# Patient Record
Sex: Female | Born: 1965
Health system: Southern US, Community
[De-identification: ages and names within clinical notes are randomized; demographics above are authoritative.]

## PROBLEM LIST (undated history)

## (undated) DIAGNOSIS — F329 Major depressive disorder, single episode, unspecified: Secondary | ICD-10-CM

## (undated) DIAGNOSIS — R112 Nausea with vomiting, unspecified: Secondary | ICD-10-CM

## (undated) DIAGNOSIS — B009 Herpesviral infection, unspecified: Secondary | ICD-10-CM

## (undated) DIAGNOSIS — N390 Urinary tract infection, site not specified: Secondary | ICD-10-CM

## (undated) DIAGNOSIS — F419 Anxiety disorder, unspecified: Secondary | ICD-10-CM

## (undated) DIAGNOSIS — F32A Depression, unspecified: Secondary | ICD-10-CM

## (undated) DIAGNOSIS — Z9889 Other specified postprocedural states: Secondary | ICD-10-CM

## (undated) DIAGNOSIS — K219 Gastro-esophageal reflux disease without esophagitis: Secondary | ICD-10-CM

## (undated) HISTORY — PX: TUBAL LIGATION: SHX77

---

## 2000-06-24 ENCOUNTER — Ambulatory Visit (HOSPITAL_COMMUNITY): Admission: RE | Admit: 2000-06-24 | Discharge: 2000-06-24 | Payer: Self-pay | Admitting: Internal Medicine

## 2000-08-03 ENCOUNTER — Other Ambulatory Visit: Admission: RE | Admit: 2000-08-03 | Discharge: 2000-08-03 | Payer: Self-pay | Admitting: Obstetrics and Gynecology

## 2001-02-08 HISTORY — PX: OTHER SURGICAL HISTORY: SHX169

## 2001-03-10 ENCOUNTER — Emergency Department (HOSPITAL_COMMUNITY): Admission: EM | Admit: 2001-03-10 | Discharge: 2001-03-10 | Payer: Self-pay | Admitting: Emergency Medicine

## 2001-07-31 ENCOUNTER — Encounter: Payer: Self-pay | Admitting: Family Medicine

## 2001-07-31 ENCOUNTER — Ambulatory Visit (HOSPITAL_COMMUNITY): Admission: RE | Admit: 2001-07-31 | Discharge: 2001-07-31 | Payer: Self-pay | Admitting: Family Medicine

## 2001-08-07 ENCOUNTER — Ambulatory Visit (HOSPITAL_COMMUNITY): Admission: RE | Admit: 2001-08-07 | Discharge: 2001-08-07 | Payer: Self-pay | Admitting: Family Medicine

## 2001-08-07 ENCOUNTER — Encounter: Payer: Self-pay | Admitting: Family Medicine

## 2001-12-04 ENCOUNTER — Encounter: Payer: Self-pay | Admitting: Obstetrics & Gynecology

## 2001-12-04 ENCOUNTER — Ambulatory Visit (HOSPITAL_COMMUNITY): Admission: RE | Admit: 2001-12-04 | Discharge: 2001-12-04 | Payer: Self-pay | Admitting: Obstetrics & Gynecology

## 2002-07-02 ENCOUNTER — Encounter: Payer: Self-pay | Admitting: Obstetrics & Gynecology

## 2002-07-02 ENCOUNTER — Ambulatory Visit (HOSPITAL_COMMUNITY): Admission: RE | Admit: 2002-07-02 | Discharge: 2002-07-02 | Payer: Self-pay | Admitting: Obstetrics & Gynecology

## 2003-02-09 HISTORY — PX: DIAGNOSTIC LAPAROSCOPY: SUR761

## 2003-10-31 ENCOUNTER — Ambulatory Visit (HOSPITAL_COMMUNITY): Admission: RE | Admit: 2003-10-31 | Discharge: 2003-10-31 | Payer: Self-pay | Admitting: Obstetrics & Gynecology

## 2004-09-08 ENCOUNTER — Ambulatory Visit (HOSPITAL_COMMUNITY): Admission: RE | Admit: 2004-09-08 | Discharge: 2004-09-08 | Payer: Self-pay | Admitting: Obstetrics & Gynecology

## 2005-09-23 ENCOUNTER — Ambulatory Visit (HOSPITAL_COMMUNITY): Admission: RE | Admit: 2005-09-23 | Discharge: 2005-09-23 | Payer: Self-pay | Admitting: Obstetrics and Gynecology

## 2006-02-08 HISTORY — PX: DILATION AND CURETTAGE OF UTERUS: SHX78

## 2006-03-27 ENCOUNTER — Emergency Department (HOSPITAL_COMMUNITY): Admission: EM | Admit: 2006-03-27 | Discharge: 2006-03-27 | Payer: Self-pay | Admitting: Family Medicine

## 2006-04-06 ENCOUNTER — Emergency Department (HOSPITAL_COMMUNITY): Admission: EM | Admit: 2006-04-06 | Discharge: 2006-04-06 | Payer: Self-pay | Admitting: Family Medicine

## 2006-06-19 ENCOUNTER — Emergency Department (HOSPITAL_COMMUNITY): Admission: EM | Admit: 2006-06-19 | Discharge: 2006-06-19 | Payer: Self-pay | Admitting: Emergency Medicine

## 2006-10-22 ENCOUNTER — Emergency Department (HOSPITAL_COMMUNITY): Admission: EM | Admit: 2006-10-22 | Discharge: 2006-10-22 | Payer: Self-pay | Admitting: Emergency Medicine

## 2006-12-02 ENCOUNTER — Encounter (HOSPITAL_COMMUNITY): Payer: Self-pay | Admitting: Obstetrics and Gynecology

## 2006-12-02 ENCOUNTER — Ambulatory Visit (HOSPITAL_COMMUNITY): Admission: RE | Admit: 2006-12-02 | Discharge: 2006-12-02 | Payer: Self-pay | Admitting: Obstetrics and Gynecology

## 2007-07-21 ENCOUNTER — Emergency Department (HOSPITAL_COMMUNITY): Admission: EM | Admit: 2007-07-21 | Discharge: 2007-07-22 | Payer: Self-pay | Admitting: Emergency Medicine

## 2007-07-29 ENCOUNTER — Encounter: Admission: RE | Admit: 2007-07-29 | Discharge: 2007-07-29 | Payer: Self-pay | Admitting: Neurosurgery

## 2008-02-09 HISTORY — PX: BLADDER SUSPENSION: SHX72

## 2008-02-09 HISTORY — PX: VAGINAL HYSTERECTOMY: SUR661

## 2008-11-04 ENCOUNTER — Encounter (HOSPITAL_COMMUNITY): Payer: Self-pay | Admitting: Obstetrics and Gynecology

## 2008-11-04 ENCOUNTER — Ambulatory Visit (HOSPITAL_COMMUNITY): Admission: RE | Admit: 2008-11-04 | Discharge: 2008-11-05 | Payer: Self-pay | Admitting: Obstetrics and Gynecology

## 2009-04-07 ENCOUNTER — Observation Stay (HOSPITAL_COMMUNITY): Admission: EM | Admit: 2009-04-07 | Discharge: 2009-04-08 | Payer: Self-pay | Admitting: Emergency Medicine

## 2009-04-07 ENCOUNTER — Ambulatory Visit: Payer: Self-pay | Admitting: Cardiovascular Disease

## 2009-04-08 ENCOUNTER — Encounter (INDEPENDENT_AMBULATORY_CARE_PROVIDER_SITE_OTHER): Payer: Self-pay | Admitting: Emergency Medicine

## 2010-01-29 ENCOUNTER — Encounter
Admission: RE | Admit: 2010-01-29 | Discharge: 2010-01-29 | Payer: Self-pay | Source: Home / Self Care | Attending: Internal Medicine | Admitting: Internal Medicine

## 2010-02-08 HISTORY — PX: BACK SURGERY: SHX140

## 2010-03-01 ENCOUNTER — Encounter: Payer: Self-pay | Admitting: Obstetrics & Gynecology

## 2010-04-29 LAB — POCT I-STAT, CHEM 8
Calcium, Ion: 1.11 mmol/L — ABNORMAL LOW (ref 1.12–1.32)
Creatinine, Ser: 0.8 mg/dL (ref 0.4–1.2)
Glucose, Bld: 109 mg/dL — ABNORMAL HIGH (ref 70–99)
HCT: 37 % (ref 36.0–46.0)
Hemoglobin: 12.6 g/dL (ref 12.0–15.0)
Potassium: 3.9 mEq/L (ref 3.5–5.1)

## 2010-04-29 LAB — CBC
HCT: 37.3 % (ref 36.0–46.0)
Hemoglobin: 12.9 g/dL (ref 12.0–15.0)
RBC: 4.28 MIL/uL (ref 3.87–5.11)
RDW: 13.5 % (ref 11.5–15.5)
WBC: 8.3 10*3/uL (ref 4.0–10.5)

## 2010-04-29 LAB — HEPATIC FUNCTION PANEL
ALT: 20 U/L (ref 0–35)
AST: 23 U/L (ref 0–37)
Alkaline Phosphatase: 60 U/L (ref 39–117)
Bilirubin, Direct: <0.1 mg/dL (ref 0.0–0.3)
Total Bilirubin: 0.5 mg/dL (ref 0.3–1.2)

## 2010-04-29 LAB — DIFFERENTIAL
Basophils Absolute: 0 10*3/uL (ref 0.0–0.1)
Eosinophils Relative: 1 % (ref 0–5)
Lymphocytes Relative: 31 % (ref 12–46)
Lymphs Abs: 2.6 10*3/uL (ref 0.7–4.0)
Monocytes Absolute: 0.5 10*3/uL (ref 0.1–1.0)
Monocytes Relative: 6 % (ref 3–12)
Neutro Abs: 5.1 10*3/uL (ref 1.7–7.7)

## 2010-04-29 LAB — POCT CARDIAC MARKERS
CKMB, poc: <1 ng/mL — ABNORMAL LOW (ref 1.0–8.0)
Troponin i, poc: <0.05 ng/mL (ref 0.00–0.09)

## 2010-05-03 LAB — POCT CARDIAC MARKERS
CKMB, poc: <1 ng/mL — ABNORMAL LOW (ref 1.0–8.0)
Myoglobin, poc: 33.3 ng/mL (ref 12–200)

## 2010-05-15 LAB — CBC
HCT: 36.1 % (ref 36.0–46.0)
Hemoglobin: 12.2 g/dL (ref 12.0–15.0)
MCHC: 34.2 g/dL (ref 30.0–36.0)
MCV: 87.6 fL (ref 78.0–100.0)
Platelets: 213 10*3/uL (ref 150–400)
RBC: 3.51 MIL/uL — ABNORMAL LOW (ref 3.87–5.11)
RBC: 4.16 MIL/uL (ref 3.87–5.11)
RDW: 14.1 % (ref 11.5–15.5)

## 2010-05-15 LAB — COMPREHENSIVE METABOLIC PANEL
ALT: 17 U/L (ref 0–35)
Alkaline Phosphatase: 62 U/L (ref 39–117)
CO2: 28 mEq/L (ref 19–32)
Chloride: 106 mEq/L (ref 96–112)
GFR calc non Af Amer: >60 mL/min (ref >60)
Glucose, Bld: 104 mg/dL — ABNORMAL HIGH (ref 70–99)
Potassium: 3.2 mEq/L — ABNORMAL LOW (ref 3.5–5.1)
Sodium: 138 mEq/L (ref 135–145)
Total Bilirubin: 0.4 mg/dL (ref 0.3–1.2)

## 2010-05-15 LAB — ABO/RH: ABO/RH(D): A POS

## 2010-05-15 LAB — URINALYSIS, ROUTINE W REFLEX MICROSCOPIC
Bilirubin Urine: NEGATIVE
Glucose, UA: NEGATIVE mg/dL
Ketones, ur: NEGATIVE mg/dL
Protein, ur: NEGATIVE mg/dL
pH: 5.5 (ref 5.0–8.0)

## 2010-05-15 LAB — URINE MICROSCOPIC-ADD ON

## 2010-05-15 LAB — PREGNANCY, URINE: Preg Test, Ur: NEGATIVE

## 2010-05-15 LAB — TYPE AND SCREEN: ABO/RH(D): A POS

## 2010-06-26 NOTE — Op Note (Signed)
NAME:  Joann Hickman, Joann Hickman NO.:  [id number]   MEDICAL RECORD NO.:  [id number]          PATIENT TYPE:  AMB   LOCATION:  DAY                           FACILITY:  [facility]   PHYSICIAN:  Lazaro Arms, M.D.   DATE OF BIRTH:  [birth year]   DATE OF PROCEDURE:  10/31/2003  DATE OF DISCHARGE:                                 OPERATIVE REPORT   PREOPERATIVE DIAGNOSES:  1.  Chronic pelvic pain.  2.  Dyspareunia.  3.  Dysmenorrhea.   POSTOPERATIVE DIAGNOSES:  1.  Chronic pelvic pain.  2.  Dyspareunia.  3.  Dysmenorrhea.   PROCEDURE:  Diagnostic laparoscopy with chromotubation.   SURGEON:  Lazaro Arms, M.D.   ANESTHESIA:  General endotracheal.   FINDINGS:  The patient had no evidence of endometriosis.  She had  appropriate fimbriae.  Ovaries were normal.  No adhesive disease.  Appendix  was normal.  Gallbladder was normal.   DESCRIPTION OF PROCEDURE:  The patient was taken to the operating room and  placed in the supine position where she underwent general endotracheal  anesthesia.  She was placed in the dorsal lithotomy position and prepped and  draped in the usual sterile fashion.  A Foley was placed.   A Pelosi cannula was placed in the cervix.  An incision was made.  A Veress  needle was placed without difficulty.  The peritoneal cavity was  insufflated.  A video laparoscope was placed with a nonbladed trocar under  direct visualization without difficulty.  A 5-mm trocar was placed two  fingerbreadths above the pubis.  The peritoneal cavity was evaluated, and  the above-noted findings were seen.  Chromotubation was performed and the  tubes dilated significantly down almost the entire length, and then maybe 30  seconds after having injected 60 cc of methylene blue diluted dye, a couple  of drops came out.  Really, there I would have to say that this is a  negative chromotubation.  As a result, all of the dye and peritoneal fluid  was removed.  Again, no endometriosis  was seen in the posterior cul-de-sac  or on the uterosacral ligaments.  The instruments were removed.  The gas was  allowed to escape.  The fascia was closed with 0 Vicryl.  The subcutaneous  tissue was closed with 3-0 Vicryl.  The skin was closed using skin staples.   The patient tolerated the procedure well.  She experienced minimal blood  loss and was taken to the recovery room in good and stable condition.  All  counts were correct.  I will see her in the office next week.  She received  Ancef and Toradol prophylactically.      LHE/MEDQ  D:  10/31/2003  T:  10/31/2003  Job:  [id number]

## 2010-06-26 NOTE — Op Note (Signed)
NAME:  Joann Hickman, Joann Hickman NO.:  [id number]   MEDICAL RECORD NO.:  [id number]          PATIENT TYPE:  AMB   LOCATION:  SDC                           FACILITY:  [facility]   PHYSICIAN:  Zelphia Cairo, MD    DATE OF BIRTH:  1965/04/23   DATE OF PROCEDURE:  DATE OF DISCHARGE:  12/02/2006                               OPERATIVE REPORT   PREOPERATIVE DIAGNOSES:  1. Menorrhagia.  2. Possible endometrial polyp.   POSTOPERATIVE DIAGNOSES:  1. Menorrhagia.  2. Possible endometrial polyp, pathology pending.   PROCEDURE:  Cervical block, hysteroscopy, D&C, polypectomy.   SURGEON:  Zelphia Cairo, M.D.   FINDINGS:  Polypoid endometrium, submucous fibroids.   SPECIMEN:  Endometrial curettings to pathology.   ESTIMATED BLOOD LOSS:  Minimal.   COMPLICATIONS:  None.   CONDITION:  Stable and extubated to recovery room.   PROCEDURE:  The patient was taken to the operating room where general  anesthesia was found to be adequate.  She was placed in the dorsal  lithotomy position using Allen stirrups.  She was prepped and draped in  sterile fashion and a catheter was used to drain her bladder for clear  urine.  Bivalve speculum was placed in the vagina and the single-toothed  macular was placed on the anterior lip of the cervix.  The cervix was  serially dilated and the diagnostic hysteroscope was inserted into the  uterine cavity.  The uterine cavity was felt to have multiple polypoid  masses and possible submucous fibroids within the endometrial cavity.  No other abnormalities were noted.  Hysteroscope was removed and  endometrial curetting was performed.  Polyp forceps were inserted and  endometrial polyps were removed.  Cervical block was then provided.  All  instruments were removed from the vagina and the patient was taken to  the recovery room in stable condition.      Zelphia Cairo, MD  Electronically Signed    GA/MEDQ  D:  12/28/2006  T:  12/28/2006  Job:   [id number]

## 2010-11-03 ENCOUNTER — Other Ambulatory Visit: Payer: Self-pay | Admitting: Neurosurgery

## 2010-11-03 DIAGNOSIS — M545 Low back pain: Secondary | ICD-10-CM

## 2010-11-04 ENCOUNTER — Ambulatory Visit
Admission: RE | Admit: 2010-11-04 | Discharge: 2010-11-04 | Disposition: A | Discharge status: Home / Self Care | Payer: 59 | Source: Ambulatory Visit | Attending: Neurosurgery | Admitting: Neurosurgery

## 2010-11-04 DIAGNOSIS — M545 Low back pain: Secondary | ICD-10-CM

## 2010-11-04 MED ORDER — GADOBENATE DIMEGLUMINE 529 MG/ML IV SOLN
18.0000 mL | Freq: Once | INTRAVENOUS | Status: AC | PRN
  Administered 2010-11-04: 18 mL via INTRAVENOUS

## 2010-11-05 LAB — POCT CARDIAC MARKERS
CKMB, poc: <1 — ABNORMAL LOW
CKMB, poc: <1 — ABNORMAL LOW
Myoglobin, poc: 44.1
Troponin i, poc: <0.05

## 2010-11-05 LAB — COMPREHENSIVE METABOLIC PANEL
ALT: 18
AST: 23
CO2: 26
Calcium: 8.6
GFR calc Af Amer: >60
Potassium: 3.6
Sodium: 139
Total Protein: 6.2

## 2010-11-05 LAB — CBC
MCV: 88.7
Platelets: 238
WBC: 8.7

## 2010-11-18 LAB — CBC
MCHC: 34.7
Platelets: 251
RDW: 12.6

## 2010-11-20 LAB — I-STAT 8, (EC8 V) (CONVERTED LAB)
BUN: 14
Bicarbonate: 26.6 — ABNORMAL HIGH
Glucose, Bld: 100 — ABNORMAL HIGH
HCT: 42
Operator id: 284141
pCO2, Ven: 46.3
pH, Ven: 7.367 — ABNORMAL HIGH

## 2010-11-20 LAB — CBC
Hemoglobin: 13.4
MCHC: 34.3
RDW: 12.8

## 2010-11-20 LAB — POCT PREGNANCY, URINE: Preg Test, Ur: NEGATIVE

## 2010-11-20 LAB — URINALYSIS, ROUTINE W REFLEX MICROSCOPIC
Hgb urine dipstick: NEGATIVE
Protein, ur: NEGATIVE
Urobilinogen, UA: 1

## 2010-11-20 LAB — DIFFERENTIAL
Basophils Absolute: 0
Basophils Relative: 1
Monocytes Absolute: 0.4
Neutro Abs: 3.7

## 2012-08-12 ENCOUNTER — Emergency Department (HOSPITAL_COMMUNITY)
Admission: EM | Admit: 2012-08-12 | Discharge: 2012-08-12 | Disposition: A | Discharge status: Home / Self Care | Payer: 59 | Source: Home / Self Care | Attending: Emergency Medicine | Admitting: Emergency Medicine

## 2012-08-12 ENCOUNTER — Encounter (HOSPITAL_COMMUNITY): Payer: Self-pay | Admitting: Emergency Medicine

## 2012-08-12 DIAGNOSIS — J019 Acute sinusitis, unspecified: Secondary | ICD-10-CM

## 2012-08-12 MED ORDER — AMOXICILLIN-POT CLAVULANATE 875-125 MG PO TABS: 1.0000 | ORAL_TABLET | Freq: Two times a day (BID) | ORAL | Status: DC

## 2012-08-12 MED ORDER — NAPROXEN 500 MG PO TABS: 500.0000 mg | ORAL_TABLET | Freq: Two times a day (BID) | ORAL | Status: DC

## 2012-08-12 MED ORDER — DEXAMETHASONE SODIUM PHOSPHATE 10 MG/ML IJ SOLN
INTRAMUSCULAR | Status: AC
  Filled 2012-08-12: qty 1

## 2012-08-12 MED ORDER — KETOROLAC TROMETHAMINE 60 MG/2ML IM SOLN
60.0000 mg | Freq: Once | INTRAMUSCULAR | Status: AC
  Administered 2012-08-12: 60 mg via INTRAMUSCULAR

## 2012-08-12 MED ORDER — MOMETASONE FUROATE 50 MCG/ACT NA SUSP: NASAL | Status: DC

## 2012-08-12 MED ORDER — KETOROLAC TROMETHAMINE 60 MG/2ML IM SOLN
INTRAMUSCULAR | Status: AC
  Filled 2012-08-12: qty 2

## 2012-08-12 MED ORDER — DEXAMETHASONE SODIUM PHOSPHATE 10 MG/ML IJ SOLN: 10.0000 mg | Freq: Once | INTRAMUSCULAR | Status: DC

## 2012-08-12 MED ORDER — PSEUDOEPHEDRINE-GUAIFENESIN ER 120-1200 MG PO TB12: ORAL_TABLET | ORAL | Status: DC

## 2012-08-12 MED ORDER — METHYLPREDNISOLONE 4 MG PO KIT: PACK | ORAL | Status: DC

## 2012-08-12 NOTE — ED Provider Notes (Signed)
History    CSN: [id number] Arrival date & time 08/12/12  1631  First MD Initiated Contact with Patient 08/12/12 1659     Chief Complaint  Patient presents with  . Facial Pain    sinus pressure and pain. nonproductive cough. denies fever and any other symptoms.    (Consider location/radiation/quality/duration/timing/severity/associated sxs/prior Treatment) HPI Comments: 47 year old female presents complaining of pain and pressure around the left eye and into the forehead and severe headache. She also has been feeling subjective fever and chills and she is very congested in her left nostril. This began on Tuesday of this week. She called her primary care physician who phoned in a Z-Pak. She started taking a Z-Pak Thursday, 3 days ago, and does not yet feel any better. She actually feels the pain and pressure getting worse since she started the Z-Pak. She has also been taking ibuprofen without much relief either. She denies rash, cough, shortness of breath, NVD, or any other symptoms associated with this. She occasionally gets sinus infections to feel exactly like this does today.  History reviewed. No pertinent past medical history. Past Surgical History  Procedure Laterality Date  . Abdominal hysterectomy    . Tubal reversal     History reviewed. No pertinent family history. History  Substance Use Topics  . Smoking status: Never Smoker   . Smokeless tobacco: Not on file  . Alcohol Use: No   OB History   Grav Para Term Preterm Abortions TAB SAB Ect Mult Living                 Review of Systems  Constitutional: Positive for fever and chills. Negative for fatigue.  HENT: Positive for congestion, sore throat (mild), postnasal drip and sinus pressure. Negative for ear pain, nosebleeds, rhinorrhea, sneezing, neck stiffness and ear discharge.   Eyes: Negative for visual disturbance.  Respiratory: Negative for cough and shortness of breath.   Cardiovascular: Negative for chest pain,  palpitations and leg swelling.  Gastrointestinal: Negative for nausea, vomiting and abdominal pain.  Endocrine: Negative for polydipsia and polyuria.  Genitourinary: Negative for dysuria, urgency and frequency.  Musculoskeletal: Negative for myalgias and arthralgias.  Skin: Negative for rash.  Neurological: Negative for dizziness, weakness and light-headedness.    Allergies  Review of patient's allergies indicates no known allergies.  Home Medications   Current Outpatient Rx  Name  Route  Sig  Dispense  Refill  . azithromycin (ZITHROMAX) 250 MG tablet   Oral   Take 250 mg by mouth daily.         Marland Kitchen escitalopram (LEXAPRO) 5 MG tablet   Oral   Take 5 mg by mouth daily.         Marland Kitchen amoxicillin-clavulanate (AUGMENTIN) 875-125 MG per tablet   Oral   Take 1 tablet by mouth every 12 (twelve) hours.   14 tablet   0   . mometasone (NASONEX) 50 MCG/ACT nasal spray      2 sprays/nostril BID   17 g   12   . naproxen (NAPROSYN) 500 MG tablet   Oral   Take 1 tablet (500 mg total) by mouth 2 (two) times daily.   60 tablet   0   . Pseudoephedrine-Guaifenesin [phone] MG TB12      1 tab PO BID   30 each   0    BP 145/89  Pulse 89  Temp(Src) 98.4 F (36.9 C) (Oral)  Resp 14  SpO2 100% Physical Exam  Nursing note and  vitals reviewed. Constitutional: She is oriented to person, place, and time. Vital signs are normal. She appears well-developed and well-nourished. She appears distressed (mild).  HENT:  Head: Normocephalic and atraumatic.  Nose: No rhinorrhea or nasal septal hematoma.  No foreign bodies. Left sinus exhibits maxillary sinus tenderness and frontal sinus tenderness.  Eyes: EOM are normal. Pupils are equal, round, and reactive to light.  Cardiovascular: Normal rate, regular rhythm and normal heart sounds.  Exam reveals no gallop and no friction rub.   No murmur heard. Pulmonary/Chest: Effort normal and breath sounds normal. No respiratory distress. She has no  wheezes. She has no rales.  Abdominal: Soft. There is no tenderness.  Neurological: She is alert and oriented to person, place, and time. She has normal strength.  Skin: Skin is warm and dry. She is not diaphoretic.  Psychiatric: She has a normal mood and affect. Her behavior is normal. Judgment normal.    ED Course  Procedures (including critical care time) Labs Reviewed - No data to display No results found. 1. Acute sinusitis     MDM  There is worsening despite 3 days of azithromycin. I have offered her an injection of Toradol and Decadron here today, she declines the Decadron because she says that she has taken prednisone before and it made her "crazy." I will switch her to Augmentin and will also start her with Mucinex D., twice a day naproxen, and Nasonex high dose 2 sprays per nostril twice a day. She will followup again in a few days if she still does not improve.   Meds ordered this encounter  Medications                . DISCONTD: dexamethasone (DECADRON) injection 10 mg    Sig:   . ketorolac (TORADOL) injection 60 mg    Sig:   . amoxicillin-clavulanate (AUGMENTIN) 875-125 MG per tablet    Sig: Take 1 tablet by mouth every 12 (twelve) hours.    Dispense:  14 tablet    Refill:  0  . DISCONTD: methylPREDNISolone (MEDROL DOSEPAK) 4 MG tablet    Sig: Use as directed    Dispense:  21 tablet    Refill:  0  . mometasone (NASONEX) 50 MCG/ACT nasal spray    Sig: 2 sprays/nostril BID    Dispense:  17 g    Refill:  12  . Pseudoephedrine-Guaifenesin [phone] MG TB12    Sig: 1 tab PO BID    Dispense:  30 each    Refill:  0  . naproxen (NAPROSYN) 500 MG tablet    Sig: Take 1 tablet (500 mg total) by mouth 2 (two) times daily.    Dispense:  60 tablet    Refill:  0     Graylon Good, PA-C 08/12/12 [phone]

## 2012-08-12 NOTE — ED Notes (Signed)
Pt declined dexathasone 10 mg injection. Injection wasted in pix. Mw,cma

## 2012-08-12 NOTE — ED Notes (Signed)
Pt c/o sinus pressure and pain. Pain and pressure along with stuffiness is worse on the left side.  Headache. Pt states that her primary wrote for a z pak and she is still currently take that med.  Symptoms started on Tuesday and gradually gotten worse. Denies fever, n/v/d

## 2012-08-12 NOTE — ED Provider Notes (Signed)
Medical screening examination/treatment/procedure(s) were performed by non-physician practitioner and as supervising physician I was immediately available for consultation/collaboration.  Leslee Home, M.D.  Reuben Likes, MD 08/12/12 [phone]

## 2013-01-24 ENCOUNTER — Emergency Department (HOSPITAL_COMMUNITY)
Admission: EM | Admit: 2013-01-24 | Discharge: 2013-01-25 | Disposition: A | Discharge status: Home / Self Care | Payer: 59 | Attending: Emergency Medicine | Admitting: Emergency Medicine

## 2013-01-24 ENCOUNTER — Encounter (HOSPITAL_COMMUNITY): Payer: Self-pay | Admitting: Emergency Medicine

## 2013-01-24 ENCOUNTER — Emergency Department (HOSPITAL_COMMUNITY): Payer: 59

## 2013-01-24 DIAGNOSIS — R059 Cough, unspecified: Secondary | ICD-10-CM | POA: Insufficient documentation

## 2013-01-24 DIAGNOSIS — R05 Cough: Secondary | ICD-10-CM

## 2013-01-24 DIAGNOSIS — R599 Enlarged lymph nodes, unspecified: Secondary | ICD-10-CM | POA: Insufficient documentation

## 2013-01-24 DIAGNOSIS — Z79899 Other long term (current) drug therapy: Secondary | ICD-10-CM | POA: Insufficient documentation

## 2013-01-24 DIAGNOSIS — IMO0002 Reserved for concepts with insufficient information to code with codable children: Secondary | ICD-10-CM | POA: Insufficient documentation

## 2013-01-24 DIAGNOSIS — R0789 Other chest pain: Secondary | ICD-10-CM | POA: Insufficient documentation

## 2013-01-24 NOTE — ED Notes (Signed)
Pt began to vomit, while in the process of sticking, unable to obtain blood work in triage

## 2013-01-24 NOTE — ED Notes (Signed)
Pt with cough and N/V x 3 days. States she diagnosed with bronchitis, and sinusitis, and that she vomits her prescription. Labored breathing noted.

## 2013-01-24 NOTE — ED Notes (Signed)
Pt c/o dry cough, vomiting, unable to sleep x3 days, The husband and wife have concerns that she is doing some kind of damage to her throat or stomach due to coughing so much, Pt has not coughed since arrival, but has vomitted twice since here

## 2013-01-25 LAB — BASIC METABOLIC PANEL
BUN: 12 mg/dL (ref 6–23)
CO2: 24 mEq/L (ref 19–32)
Calcium: 8.4 mg/dL (ref 8.4–10.5)
Creatinine, Ser: 0.77 mg/dL (ref 0.50–1.10)
Glucose, Bld: 130 mg/dL — ABNORMAL HIGH (ref 70–99)

## 2013-01-25 LAB — CBC
HCT: 40.3 % (ref 36.0–46.0)
Hemoglobin: 14.4 g/dL (ref 12.0–15.0)
MCH: 31.9 pg (ref 26.0–34.0)
MCV: 89.2 fL (ref 78.0–100.0)
RBC: 4.52 MIL/uL (ref 3.87–5.11)

## 2013-01-25 MED ORDER — BENZONATATE 100 MG PO CAPS
200.0000 mg | ORAL_CAPSULE | Freq: Once | ORAL | Status: AC
  Administered 2013-01-25: 200 mg via ORAL
  Filled 2013-01-25: qty 2

## 2013-01-25 MED ORDER — PROMETHAZINE-DM 6.25-15 MG/5ML PO SYRP: 5.0000 mL | ORAL_SOLUTION | Freq: Four times a day (QID) | ORAL | Status: DC | PRN

## 2013-01-25 MED ORDER — BENZONATATE 100 MG PO CAPS: 100.0000 mg | ORAL_CAPSULE | Freq: Three times a day (TID) | ORAL | Status: DC

## 2013-01-25 MED ORDER — PREDNISONE 50 MG PO TABS: 50.0000 mg | ORAL_TABLET | Freq: Every day | ORAL | Status: DC

## 2013-01-25 NOTE — ED Provider Notes (Signed)
CSN: [id number]     Arrival date & time 01/24/13  2313 History   First MD Initiated Contact with Patient 01/25/13 0051     Chief Complaint  Patient presents with  . Cough   (Consider location/radiation/quality/duration/timing/severity/associated sxs/prior Treatment) Patient is a 47 y.o. female presenting with cough. The history is provided by the patient.  Cough Cough characteristics:  Dry Severity:  Severe Duration:  4 days Timing:  Constant Progression:  Unchanged Chronicity:  New Smoker: no   Context: not fumes, not occupational exposure, not sick contacts, not smoke exposure and not upper respiratory infection   Relieved by:  Nothing Ineffective treatments:  Cough suppressants Associated symptoms: chest pain   Associated symptoms: no chills, no diaphoresis, no eye discharge, no fever, no myalgias, no rash, no rhinorrhea, no shortness of breath, no sinus congestion, no sore throat and no wheezing   Associated symptoms comment:  Chest pain is bilateral and only with cough   History reviewed. No pertinent past medical history. Past Surgical History  Procedure Laterality Date  . Abdominal hysterectomy    . Tubal reversal     No family history on file. History  Substance Use Topics  . Smoking status: Never Smoker   . Smokeless tobacco: Not on file  . Alcohol Use: No   OB History   Grav Para Term Preterm Abortions TAB SAB Ect Mult Living                 Review of Systems  Constitutional: Negative for fever, chills and diaphoresis.  HENT: Negative for rhinorrhea and sore throat.   Eyes: Negative for discharge.  Respiratory: Positive for cough. Negative for shortness of breath and wheezing.   Cardiovascular: Positive for chest pain.  Musculoskeletal: Negative for myalgias.  Skin: Negative for rash.    Allergies  Review of patient's allergies indicates no known allergies.  Home Medications   Current Outpatient Rx  Name  Route  Sig  Dispense  Refill  . cefdinir  (OMNICEF) 300 MG capsule   Oral   Take 300 mg by mouth 2 (two) times daily.         Marland Kitchen escitalopram (LEXAPRO) 5 MG tablet   Oral   Take 5 mg by mouth daily.         . Esomeprazole Magnesium (NEXIUM PO)   Oral   Take 1 capsule by mouth daily. OTC         . HYDROCODONE-CHLORPHENIRAMINE PO   Oral   Take 10 mLs by mouth every 6 (six) hours as needed (for cough).         . benzonatate (TESSALON) 100 MG capsule   Oral   Take 1 capsule (100 mg total) by mouth every 8 (eight) hours.   21 capsule   0   . predniSONE (DELTASONE) 50 MG tablet   Oral   Take 1 tablet (50 mg total) by mouth daily.   5 tablet   0   . promethazine-dextromethorphan (PROMETHAZINE-DM) 6.25-15 MG/5ML syrup   Oral   Take 5 mLs by mouth 4 (four) times daily as needed for cough.   118 mL   1    BP 118/62  Pulse 90  Temp(Src) 98.4 F (36.9 C) (Oral)  Resp 23  SpO2 99% Physical Exam  Nursing note and vitals reviewed. Constitutional: She is oriented to person, place, and time. She appears well-developed and well-nourished.  HENT:  Head: Normocephalic and atraumatic.  Eyes: EOM are normal. Pupils are equal, round, and  reactive to light.  Neck: Neck supple. No JVD present.  Cardiovascular: Normal rate, regular rhythm and normal heart sounds.   No murmur heard. Pulmonary/Chest: Effort normal. No respiratory distress. She has no wheezes. She has no rales. She exhibits no tenderness.  Abdominal: Soft. She exhibits no distension. There is no tenderness. There is no rebound and no guarding.  Lymphadenopathy:    She has cervical adenopathy.  Neurological: She is alert and oriented to person, place, and time.  Skin: Skin is warm and dry.    ED Course  Procedures (including critical care time) Labs Review Labs Reviewed  CBC - Abnormal; Notable for the following:    WBC 10.6 (*)    All other components within normal limits  BASIC METABOLIC PANEL - Abnormal; Notable for the following:    Glucose,  Bld 130 (*)    All other components within normal limits   Imaging Review Dg Chest 2 View  01/25/2013   CLINICAL DATA:  Cough  EXAM: CHEST  2 VIEW  COMPARISON:  Prior CT from 04/07/2009  FINDINGS: The cardiac and mediastinal silhouettes are within normal limits.  The lungs are normally inflated. No airspace consolidation, pleural effusion, or pulmonary edema is identified. There is no pneumothorax.  No acute osseous abnormality identified.  IMPRESSION: No active cardiopulmonary disease.   Electronically Signed   By: Rise Mu M.D.   On: 01/25/2013 00:28    EKG Interpretation   None       MDM   1. Cough    Pt comes in with cough x 4 days, unable to sleep. Tried codeine, with no relief. No URI like sx, no ACE, no hx of cough, no hx of lung dz., no allergies. Pt has pain chest pain with cough, no dib, no pain with deep inspirations. No fevers. CXR is clear. Pt has no PE and DVT risk factors and is PERC negative.  Return precautions discussed, and need to see pcp in 10 days if not better advised.   Derwood Kaplan, MD 01/25/13 [phone]

## 2013-05-17 ENCOUNTER — Other Ambulatory Visit: Payer: Self-pay | Admitting: Dermatology

## 2013-07-02 ENCOUNTER — Encounter (HOSPITAL_COMMUNITY): Payer: Self-pay | Admitting: Emergency Medicine

## 2013-07-02 ENCOUNTER — Emergency Department (HOSPITAL_COMMUNITY)
Admission: EM | Admit: 2013-07-02 | Discharge: 2013-07-02 | Discharge status: Against Medical Advice | Payer: 59 | Attending: Emergency Medicine | Admitting: Emergency Medicine

## 2013-07-02 DIAGNOSIS — R1031 Right lower quadrant pain: Secondary | ICD-10-CM | POA: Insufficient documentation

## 2013-07-02 DIAGNOSIS — R110 Nausea: Secondary | ICD-10-CM | POA: Insufficient documentation

## 2013-07-02 LAB — CBC WITH DIFFERENTIAL/PLATELET
BASOS ABS: 0 10*3/uL (ref 0.0–0.1)
Basophils Relative: 0 % (ref 0–1)
EOS ABS: 0.2 10*3/uL (ref 0.0–0.7)
EOS PCT: 2 % (ref 0–5)
HEMATOCRIT: 38.3 % (ref 36.0–46.0)
Hemoglobin: 13.6 g/dL (ref 12.0–15.0)
Lymphocytes Relative: 28 % (ref 12–46)
Lymphs Abs: 2.1 10*3/uL (ref 0.7–4.0)
MCH: 31.6 pg (ref 26.0–34.0)
MCHC: 35.5 g/dL (ref 30.0–36.0)
MCV: 89.1 fL (ref 78.0–100.0)
MONO ABS: 0.5 10*3/uL (ref 0.1–1.0)
Monocytes Relative: 7 % (ref 3–12)
Neutro Abs: 4.7 10*3/uL (ref 1.7–7.7)
Neutrophils Relative %: 63 % (ref 43–77)
Platelets: 255 10*3/uL (ref 150–400)
RBC: 4.3 MIL/uL (ref 3.87–5.11)
RDW: 12.1 % (ref 11.5–15.5)
WBC: 7.5 10*3/uL (ref 4.0–10.5)

## 2013-07-02 LAB — URINE MICROSCOPIC-ADD ON

## 2013-07-02 LAB — URINALYSIS, ROUTINE W REFLEX MICROSCOPIC
Bilirubin Urine: NEGATIVE
Glucose, UA: NEGATIVE mg/dL
Ketones, ur: NEGATIVE mg/dL
Leukocytes, UA: NEGATIVE
Nitrite: NEGATIVE
Protein, ur: NEGATIVE mg/dL
Specific Gravity, Urine: 1.023 (ref 1.005–1.030)
UROBILINOGEN UA: 1 mg/dL (ref 0.0–1.0)
pH: 7 (ref 5.0–8.0)

## 2013-07-02 LAB — COMPREHENSIVE METABOLIC PANEL
ALT: 15 U/L (ref 0–35)
AST: 15 U/L (ref 0–37)
Albumin: 3.6 g/dL (ref 3.5–5.2)
Alkaline Phosphatase: 68 U/L (ref 39–117)
BUN: 10 mg/dL (ref 6–23)
CALCIUM: 9 mg/dL (ref 8.4–10.5)
CO2: 25 meq/L (ref 19–32)
CREATININE: 0.74 mg/dL (ref 0.50–1.10)
Chloride: 100 mEq/L (ref 96–112)
GFR calc Af Amer: >90 mL/min (ref >90)
Glucose, Bld: 99 mg/dL (ref 70–99)
Potassium: 4.2 mEq/L (ref 3.7–5.3)
Sodium: 139 mEq/L (ref 137–147)
Total Bilirubin: 0.2 mg/dL — ABNORMAL LOW (ref 0.3–1.2)
Total Protein: 6.9 g/dL (ref 6.0–8.3)

## 2013-07-02 NOTE — ED Notes (Signed)
Patient informed this nurse that she was going to go home.  Informed her to come back if she was feeling worse.  Encouraged her to stay but she stated that she was going home

## 2013-07-02 NOTE — ED Notes (Signed)
Reports onset 3 hours ago of sharp RLQ pain and mild nausea. Last bm was today and diarrhea. Denies any urinary or vaginal symptoms.

## 2013-12-06 ENCOUNTER — Other Ambulatory Visit: Payer: Self-pay | Admitting: Obstetrics and Gynecology

## 2013-12-07 LAB — CYTOLOGY - PAP

## 2014-04-07 ENCOUNTER — Emergency Department (HOSPITAL_COMMUNITY): Payer: 59

## 2014-04-07 ENCOUNTER — Encounter (HOSPITAL_COMMUNITY): Payer: Self-pay

## 2014-04-07 ENCOUNTER — Emergency Department (HOSPITAL_COMMUNITY)
Admission: EM | Admit: 2014-04-07 | Discharge: 2014-04-07 | Disposition: A | Discharge status: Home / Self Care | Payer: 59 | Attending: Emergency Medicine | Admitting: Emergency Medicine

## 2014-04-07 DIAGNOSIS — Z79899 Other long term (current) drug therapy: Secondary | ICD-10-CM | POA: Diagnosis not present

## 2014-04-07 DIAGNOSIS — Y9389 Activity, other specified: Secondary | ICD-10-CM | POA: Insufficient documentation

## 2014-04-07 DIAGNOSIS — S82431A Displaced oblique fracture of shaft of right fibula, initial encounter for closed fracture: Secondary | ICD-10-CM | POA: Insufficient documentation

## 2014-04-07 DIAGNOSIS — Y998 Other external cause status: Secondary | ICD-10-CM | POA: Diagnosis not present

## 2014-04-07 DIAGNOSIS — W010XXA Fall on same level from slipping, tripping and stumbling without subsequent striking against object, initial encounter: Secondary | ICD-10-CM | POA: Diagnosis not present

## 2014-04-07 DIAGNOSIS — F419 Anxiety disorder, unspecified: Secondary | ICD-10-CM | POA: Diagnosis not present

## 2014-04-07 DIAGNOSIS — Y9289 Other specified places as the place of occurrence of the external cause: Secondary | ICD-10-CM | POA: Insufficient documentation

## 2014-04-07 DIAGNOSIS — S99911A Unspecified injury of right ankle, initial encounter: Secondary | ICD-10-CM | POA: Diagnosis present

## 2014-04-07 DIAGNOSIS — K219 Gastro-esophageal reflux disease without esophagitis: Secondary | ICD-10-CM | POA: Diagnosis not present

## 2014-04-07 DIAGNOSIS — S82401A Unspecified fracture of shaft of right fibula, initial encounter for closed fracture: Secondary | ICD-10-CM

## 2014-04-07 HISTORY — DX: Anxiety disorder, unspecified: F41.9

## 2014-04-07 HISTORY — DX: Gastro-esophageal reflux disease without esophagitis: K21.9

## 2014-04-07 MED ORDER — FENTANYL CITRATE 0.05 MG/ML IJ SOLN
100.0000 ug | Freq: Once | INTRAMUSCULAR | Status: AC
  Administered 2014-04-07: 100 ug via INTRAVENOUS
  Filled 2014-04-07: qty 2

## 2014-04-07 MED ORDER — ONDANSETRON 4 MG PO TBDP: 4.0000 mg | ORAL_TABLET | Freq: Three times a day (TID) | ORAL | Status: DC | PRN

## 2014-04-07 MED ORDER — NAPROXEN 250 MG PO TABS: 250.0000 mg | ORAL_TABLET | Freq: Two times a day (BID) | ORAL | Status: DC

## 2014-04-07 MED ORDER — HYDROCODONE-ACETAMINOPHEN 5-325 MG PO TABS: 1.0000 | ORAL_TABLET | Freq: Four times a day (QID) | ORAL | Status: DC | PRN

## 2014-04-07 NOTE — ED Notes (Signed)
Bed: OP92 Expected date: 04/07/14 Expected time: 9:06 AM Means of arrival: Ambulance Comments: Fall

## 2014-04-07 NOTE — ED Provider Notes (Signed)
CSN: [id number]     Arrival date & time 04/07/14  9326 History   First MD Initiated Contact with Patient 04/07/14 [phone]     Chief Complaint  Patient presents with  . Fall  . Ankle Pain   Joann Hickman is a 49 y.o. female who presents to the emergency department complaining of right ankle pain after a slip and fall on a blanket on hardwood floor just prior to arrival. Patient is complaining of 10 out of 10 right lateral ankle pain that is worse with movement. Patient reports she slipped on a blanket that was on hardwood floor and her leg went out from under her. She denies head injury or loss of consciousness. She reports some nausea after pain medicine, that has resolved. She denies fevers, recent illness, previous ankle fracture, numbness, headache, abdominal pain, vomiting.   (Consider location/radiation/quality/duration/timing/severity/associated sxs/prior Treatment) HPI  Past Medical History  Diagnosis Date  . GERD (gastroesophageal reflux disease)   . Anxiety    Past Surgical History  Procedure Laterality Date  . Abdominal hysterectomy    . Tubal reversal     History reviewed. No pertinent family history. History  Substance Use Topics  . Smoking status: Never Smoker   . Smokeless tobacco: Not on file  . Alcohol Use: No   OB History    No data available     Review of Systems  Constitutional: Negative for fever.  Respiratory: Negative for cough and shortness of breath.   Gastrointestinal: Positive for nausea. Negative for vomiting and abdominal pain.  Musculoskeletal: Positive for joint swelling.       Right ankle pain  Skin: Negative for pallor and rash.  Neurological: Negative for dizziness, syncope, weakness, light-headedness, numbness and headaches.      Allergies  Review of patient's allergies indicates no known allergies.  Home Medications   Prior to Admission medications   Medication Sig Start Date End Date Taking? Authorizing Provider  acyclovir  ointment (ZOVIRAX) 5 % Apply 1 application topically every 3 (three) hours.  03/26/14   Historical Provider, MD  ALPRAZolam Duanne Moron) 0.25 MG tablet Take 0.25 mg by mouth 2 (two) times daily as needed. 03/26/14   Historical Provider, MD  escitalopram (LEXAPRO) 5 MG tablet Take 5 mg by mouth daily.    Historical Provider, MD  HYDROcodone-acetaminophen (NORCO) 5-325 MG per tablet Take 1-2 tablets by mouth every 6 (six) hours as needed for moderate pain. 04/07/14   Verda Cumins Vaani Morren, PA-C  LEXAPRO 10 MG tablet Take 10 mg by mouth daily. 02/24/14   Historical Provider, MD  naproxen (NAPROSYN) 250 MG tablet Take 1 tablet (250 mg total) by mouth 2 (two) times daily with a meal. 04/07/14   Verda Cumins Secily Walthour, PA-C  NEXIUM 24HR 20 MG capsule Take 20 mg by mouth daily. 03/18/14   Historical Provider, MD  ondansetron (ZOFRAN ODT) 4 MG disintegrating tablet Take 1 tablet (4 mg total) by mouth every 8 (eight) hours as needed for nausea or vomiting. 04/07/14   Verda Cumins Rilley Poulter, PA-C  PROAIR HFA 108 [phone] BASE) MCG/ACT inhaler  01/17/14   Historical Provider, MD  valACYclovir (VALTREX) 1000 MG tablet  03/26/14   Historical Provider, MD   BP 101/63 mmHg  Pulse 71  Temp(Src) 98 F (36.7 C) (Oral)  Resp 18  SpO2 95% Physical Exam  Constitutional: She is oriented to person, place, and time. She appears well-developed and well-nourished. No distress.  HENT:  Head: Normocephalic and atraumatic.  Eyes: Conjunctivae are  normal. Pupils are equal, round, and reactive to light. Right eye exhibits no discharge. Left eye exhibits no discharge.  Neck: Neck supple.  Cardiovascular: Normal rate, regular rhythm, normal heart sounds and intact distal pulses.   Bilateral radial, posterior tibialis and dorsalis pedis pulses are intact.   Pulmonary/Chest: Effort normal and breath sounds normal. No respiratory distress. She has no wheezes. She has no rales.  Abdominal: Soft. There is no tenderness.  Musculoskeletal: She  exhibits edema and tenderness.  Mild edema to right lateral ankle without obvious deformity. Tenderness over lateral aspect of right ankle. Posterior tibialis and dorsalis pedis pulses are intact. Sensation intact in her foot.  Lymphadenopathy:    She has no cervical adenopathy.  Neurological: She is alert and oriented to person, place, and time. Coordination normal.  Skin: Skin is warm and dry. No rash noted. She is not diaphoretic. No erythema. No pallor.  Psychiatric: She has a normal mood and affect. Her behavior is normal.  Nursing note and vitals reviewed.   ED Course  Procedures (including critical care time) Labs Review Labs Reviewed - No data to display  Imaging Review Dg Ankle Complete Right  04/07/2014   CLINICAL DATA:  49 year old female with lateral ankle pain and swelling after slipping all in the dogs that earlier today and falling in her home.  EXAM: RIGHT ANKLE - COMPLETE 3+ VIEW  COMPARISON:  None.  FINDINGS: Acute nondisplaced obliquely oriented fracture through the distal fibula at the level of the tibial plafond. Associated soft tissue swelling. The remainder the visualized bones and joints are intact and unremarkable. Normal bony mineralization. No lytic or blastic osseous lesion.  IMPRESSION: Acute nondisplaced oblique fracture through the distal fibular diaphysis.   Electronically Signed   By: Jacqulynn Cadet M.D.   On: 04/07/2014 10:05     EKG Interpretation None      Filed Vitals:   04/07/14 0921  BP: 101/63  Pulse: 71  Temp: 98 F (36.7 C)  TempSrc: Oral  Resp: 18  SpO2: 95%     MDM   Meds given in ED:  Medications  fentaNYL (SUBLIMAZE) injection 100 mcg (100 mcg Intravenous Given 04/07/14 1018)    New Prescriptions   HYDROCODONE-ACETAMINOPHEN (NORCO) 5-325 MG PER TABLET    Take 1-2 tablets by mouth every 6 (six) hours as needed for moderate pain.   NAPROXEN (NAPROSYN) 250 MG TABLET    Take 1 tablet (250 mg total) by mouth 2 (two) times daily  with a meal.   ONDANSETRON (ZOFRAN ODT) 4 MG DISINTEGRATING TABLET    Take 1 tablet (4 mg total) by mouth every 8 (eight) hours as needed for nausea or vomiting.    Final diagnoses:  Closed fibular fracture, right, initial encounter   This is a 49 y.o. female who presents to the emergency department complaining of right ankle pain after a slip and fall on a blanket on hardwood floor just prior to arrival. Patient is planning to 10 right lateral ankle pain. The patient denies any other injury, head injury or loss of consciousness. There is a mild amount of edema to the lateral aspect of her right ankle. There is no obvious deformity. Patient right foot is neurovascularly intact. X-ray of her right ankle indicated an acute nondisplaced oblique fracture through the distal fibular diaphysis. Will place in posterior splint and have her follow-up with orthopedic surgeon Dr. Tamera Punt in 1 week. Patient is also provided crutches. I advised patient to be non-weight bearing. Provided the  patient with prescriptions for naproxen, Norco for breakthrough pain and Zofran for nausea. The patient would like nausea medication in case of nausea due to Norco. I provided precautions on narcotic pain medicine use. Advised patient to follow-up with orthopedics or Dr. Tamera Punt in 1 week. I advised the patient to follow-up with their primary care provider this week. I advised the patient to return to the emergency department with new or worsening symptoms or new concerns. The patient verbalized understanding and agreement with plan.    This patient was discussed with Dr. Regenia Skeeter who agrees with assessment and plan.     Joann Hays, PA-C 04/07/14 1110  Sherwood Gambler, MD 04/15/14 2330

## 2014-04-07 NOTE — Discharge Instructions (Signed)
Fibular Fracture, Ankle, Adult, Treated With or Without Immobilization A fibular fracture at your ankle is a break (fracture) bone in the smallest of the two bones in your lower leg, located on the outside of your leg (fibula) close to the area at your ankle joint. CAUSES  Rolling your ankle.  Twisting your ankle.  Extreme flexing or extending of your foot.  Severe force on your ankle as when falling from a distance. RISK FACTORS  Jumping activities.  Participation in sports.  Osteoporosis.  Advanced age.  Previous ankle injuries. SIGNS AND SYMPTOMS  Pain.  Swelling.  Inability to put weight on injured ankle.  Bruising.  Bone deformities at site of injury. DIAGNOSIS  This fracture is diagnosed with the help of an X-ray exam. TREATMENT  If the fractured bone did not move out of place it usually will heal without problems and does casting or splinting. If immobilization is needed for comfort or the fractured bone moved out of place and will not heal properly with immobilization, a cast or splint will be used. HOME CARE INSTRUCTIONS   Apply ice to the area of injury:  Put ice in a plastic bag.  Place a towel between your skin and the bag.  Leave the ice on for 20 minutes, 2-3 times a day.  Use crutches as directed. Resume walking without crutches as directed by your health care provider.  Only take over-the-counter or prescription medicines for pain, discomfort, or fever as directed by your health care provider.  If you have a removable splint or boot, do not remove the boot unless directed by your health care provider. SEEK MEDICAL CARE IF:   You have continued pain or more swelling  The medications do not control the pain. SEEK IMMEDIATE MEDICAL CARE IF:  You develop severe pain in the leg or foot.  Your skin or nails below the injury turn blue or grey or feel cold or numb. MAKE SURE YOU:   Understand these instructions.  Will watch your  condition.  Will get help right away if you are not doing well or get worse. Document Released: 01/25/2005 Document Revised: 11/15/2012 Document Reviewed: 09/06/2012 [facility] Patient Information 2015 [place], [place]. This information is not intended to replace advice given to you by your health care provider. Make sure you discuss any questions you have with your health care provider.  Cast or Splint Care Casts and splints support injured limbs and keep bones from moving while they heal. It is important to care for your cast or splint at home.  HOME CARE INSTRUCTIONS  Keep the cast or splint uncovered during the drying period. It can take 24 to 48 hours to dry if it is made of plaster. A fiberglass cast will dry in less than 1 hour.  Do not rest the cast on anything harder than a pillow for the first 24 hours.  Do not put weight on your injured limb or apply pressure to the cast until your health care provider gives you permission.  Keep the cast or splint dry. Wet casts or splints can lose their shape and may not support the limb as well. A wet cast that has lost its shape can also create harmful pressure on your skin when it dries. Also, wet skin can become infected.  Cover the cast or splint with a plastic bag when bathing or when out in the rain or snow. If the cast is on the trunk of the body, take sponge baths until the cast  is removed.  If your cast does become wet, dry it with a towel or a blow dryer on the cool setting only.  Keep your cast or splint clean. Soiled casts may be wiped with a moistened cloth.  Do not place any hard or soft foreign objects under your cast or splint, such as cotton, toilet paper, lotion, or powder.  Do not try to scratch the skin under the cast with any object. The object could get stuck inside the cast. Also, scratching could lead to an infection. If itching is a problem, use a blow dryer on a cool setting to relieve discomfort.  Do not trim or cut  your cast or remove padding from inside of it.  Exercise all joints next to the injury that are not immobilized by the cast or splint. For example, if you have a long leg cast, exercise the hip joint and toes. If you have an arm cast or splint, exercise the shoulder, elbow, thumb, and fingers.  Elevate your injured arm or leg on 1 or 2 pillows for the first 1 to 3 days to decrease swelling and pain.It is best if you can comfortably elevate your cast so it is higher than your heart. SEEK MEDICAL CARE IF:   Your cast or splint cracks.  Your cast or splint is too tight or too loose.  You have unbearable itching inside the cast.  Your cast becomes wet or develops a soft spot or area.  You have a bad smell coming from inside your cast.  You get an object stuck under your cast.  Your skin around the cast becomes red or raw.  You have new pain or worsening pain after the cast has been applied. SEEK IMMEDIATE MEDICAL CARE IF:   You have fluid leaking through the cast.  You are unable to move your fingers or toes.  You have discolored (blue or white), cool, painful, or very swollen fingers or toes beyond the cast.  You have tingling or numbness around the injured area.  You have severe pain or pressure under the cast.  You have any difficulty with your breathing or have shortness of breath.  You have chest pain. Document Released: 01/23/2000 Document Revised: 11/15/2012 Document Reviewed: 08/03/2012 [facility] Patient Information 2015 [place], [place]. This information is not intended to replace advice given to you by your health care provider. Make sure you discuss any questions you have with your health care provider.

## 2014-04-07 NOTE — ED Notes (Signed)
Per EMS, pt from home.  Pt was opening curtains and tripped over dog rolling rt ankle.  Pt c/o ankle pain.  No LOC.  No head trauma.  Vitals: 121/65, hr 82, resp 16, 98% ra  200 mcg fentanyl.  IV 20 g LAC.

## 2014-04-10 ENCOUNTER — Other Ambulatory Visit: Payer: Self-pay | Admitting: Orthopedic Surgery

## 2014-04-11 ENCOUNTER — Encounter (HOSPITAL_BASED_OUTPATIENT_CLINIC_OR_DEPARTMENT_OTHER): Payer: Self-pay | Admitting: *Deleted

## 2014-04-11 NOTE — Progress Notes (Signed)
No labs needed

## 2014-04-16 ENCOUNTER — Ambulatory Visit (HOSPITAL_BASED_OUTPATIENT_CLINIC_OR_DEPARTMENT_OTHER): Payer: 59 | Admitting: Anesthesiology

## 2014-04-16 ENCOUNTER — Encounter (HOSPITAL_BASED_OUTPATIENT_CLINIC_OR_DEPARTMENT_OTHER): Payer: Self-pay

## 2014-04-16 ENCOUNTER — Encounter (HOSPITAL_BASED_OUTPATIENT_CLINIC_OR_DEPARTMENT_OTHER)
Admission: RE | Disposition: A | Discharge status: Home / Self Care | Payer: Self-pay | Source: Ambulatory Visit | Attending: Orthopedic Surgery

## 2014-04-16 ENCOUNTER — Ambulatory Visit (HOSPITAL_BASED_OUTPATIENT_CLINIC_OR_DEPARTMENT_OTHER)
Admission: RE | Admit: 2014-04-16 | Discharge: 2014-04-16 | Disposition: A | Discharge status: Home / Self Care | Payer: 59 | Source: Ambulatory Visit | Attending: Orthopedic Surgery | Admitting: Orthopedic Surgery

## 2014-04-16 DIAGNOSIS — Z6834 Body mass index (BMI) 34.0-34.9, adult: Secondary | ICD-10-CM | POA: Diagnosis not present

## 2014-04-16 DIAGNOSIS — Z9851 Tubal ligation status: Secondary | ICD-10-CM | POA: Diagnosis not present

## 2014-04-16 DIAGNOSIS — Z90710 Acquired absence of both cervix and uterus: Secondary | ICD-10-CM | POA: Diagnosis not present

## 2014-04-16 DIAGNOSIS — K219 Gastro-esophageal reflux disease without esophagitis: Secondary | ICD-10-CM | POA: Insufficient documentation

## 2014-04-16 DIAGNOSIS — F419 Anxiety disorder, unspecified: Secondary | ICD-10-CM | POA: Insufficient documentation

## 2014-04-16 DIAGNOSIS — S8261XA Displaced fracture of lateral malleolus of right fibula, initial encounter for closed fracture: Secondary | ICD-10-CM | POA: Diagnosis present

## 2014-04-16 DIAGNOSIS — W19XXXA Unspecified fall, initial encounter: Secondary | ICD-10-CM | POA: Insufficient documentation

## 2014-04-16 DIAGNOSIS — F329 Major depressive disorder, single episode, unspecified: Secondary | ICD-10-CM | POA: Diagnosis not present

## 2014-04-16 HISTORY — DX: Other specified postprocedural states: Z98.890

## 2014-04-16 HISTORY — DX: Other specified postprocedural states: R11.2

## 2014-04-16 HISTORY — DX: Herpesviral infection, unspecified: B00.9

## 2014-04-16 HISTORY — DX: Depression, unspecified: F32.A

## 2014-04-16 HISTORY — DX: Major depressive disorder, single episode, unspecified: F32.9

## 2014-04-16 HISTORY — PX: ORIF ANKLE FRACTURE: SHX5408

## 2014-04-16 SURGERY — OPEN REDUCTION INTERNAL FIXATION (ORIF) ANKLE FRACTURE
Anesthesia: General | Site: Ankle | Laterality: Right

## 2014-04-16 MED ORDER — LIDOCAINE HCL (CARDIAC) 20 MG/ML IV SOLN
INTRAVENOUS | Status: DC | PRN
  Administered 2014-04-16: 60 mg via INTRAVENOUS

## 2014-04-16 MED ORDER — OXYCODONE HCL 5 MG PO TABS
ORAL_TABLET | ORAL | Status: AC
  Filled 2014-04-16: qty 1

## 2014-04-16 MED ORDER — OXYCODONE HCL 5 MG PO TABS
5.0000 mg | ORAL_TABLET | Freq: Once | ORAL | Status: AC | PRN
  Administered 2014-04-16: 5 mg via ORAL

## 2014-04-16 MED ORDER — MIDAZOLAM HCL 2 MG/2ML IJ SOLN
INTRAMUSCULAR | Status: AC
  Filled 2014-04-16: qty 2

## 2014-04-16 MED ORDER — OXYCODONE HCL 5 MG/5ML PO SOLN: 5.0000 mg | Freq: Once | ORAL | Status: AC | PRN

## 2014-04-16 MED ORDER — MIDAZOLAM HCL 2 MG/2ML IJ SOLN
INTRAMUSCULAR | Status: DC
Start: 2014-04-16 — End: 2014-04-16
  Filled 2014-04-16: qty 4

## 2014-04-16 MED ORDER — HYDROMORPHONE HCL 1 MG/ML IJ SOLN
INTRAMUSCULAR | Status: AC
  Filled 2014-04-16: qty 1

## 2014-04-16 MED ORDER — PROMETHAZINE HCL 25 MG/ML IJ SOLN: 6.2500 mg | INTRAMUSCULAR | Status: DC | PRN

## 2014-04-16 MED ORDER — LACTATED RINGERS IV SOLN
INTRAVENOUS | Status: DC
  Administered 2014-04-16 (×2): via INTRAVENOUS

## 2014-04-16 MED ORDER — PROPOFOL 10 MG/ML IV BOLUS
INTRAVENOUS | Status: DC | PRN
  Administered 2014-04-16: 200 mg via INTRAVENOUS
  Administered 2014-04-16: 50 mg via INTRAVENOUS

## 2014-04-16 MED ORDER — KETOROLAC TROMETHAMINE 30 MG/ML IJ SOLN
INTRAMUSCULAR | Status: DC | PRN
  Administered 2014-04-16: 30 mg via INTRAVENOUS

## 2014-04-16 MED ORDER — FENTANYL CITRATE 0.05 MG/ML IJ SOLN
INTRAMUSCULAR | Status: DC | PRN
  Administered 2014-04-16 (×2): 25 ug via INTRAVENOUS
  Administered 2014-04-16: 100 ug via INTRAVENOUS

## 2014-04-16 MED ORDER — CEFAZOLIN SODIUM-DEXTROSE 2-3 GM-% IV SOLR
INTRAVENOUS | Status: AC
  Filled 2014-04-16: qty 50

## 2014-04-16 MED ORDER — OXYCODONE-ACETAMINOPHEN 5-325 MG PO TABS: 1.0000 | ORAL_TABLET | ORAL | Status: DC | PRN

## 2014-04-16 MED ORDER — HYDROMORPHONE HCL 1 MG/ML IJ SOLN
0.2500 mg | INTRAMUSCULAR | Status: DC | PRN
  Administered 2014-04-16 (×2): 0.25 mg via INTRAVENOUS
  Administered 2014-04-16 (×2): 0.5 mg via INTRAVENOUS

## 2014-04-16 MED ORDER — MIDAZOLAM HCL 2 MG/2ML IJ SOLN
1.0000 mg | INTRAMUSCULAR | Status: DC | PRN
  Administered 2014-04-16: 2 mg via INTRAVENOUS

## 2014-04-16 MED ORDER — BUPIVACAINE HCL (PF) 0.5 % IJ SOLN
INTRAMUSCULAR | Status: AC
  Filled 2014-04-16: qty 30

## 2014-04-16 MED ORDER — CEFAZOLIN SODIUM-DEXTROSE 2-3 GM-% IV SOLR
2.0000 g | INTRAVENOUS | Status: AC
  Administered 2014-04-16: 2 g via INTRAVENOUS

## 2014-04-16 MED ORDER — FENTANYL CITRATE 0.05 MG/ML IJ SOLN
INTRAMUSCULAR | Status: AC
  Filled 2014-04-16: qty 4

## 2014-04-16 MED ORDER — SCOPOLAMINE 1 MG/3DAYS TD PT72
1.0000 | MEDICATED_PATCH | TRANSDERMAL | Status: DC
  Administered 2014-04-16: 1.5 mg via TRANSDERMAL
  Administered 2014-04-16: 1 via TRANSDERMAL

## 2014-04-16 MED ORDER — BUPIVACAINE HCL (PF) 0.5 % IJ SOLN
INTRAMUSCULAR | Status: DC | PRN
  Administered 2014-04-16: 10 mL

## 2014-04-16 MED ORDER — DEXAMETHASONE SODIUM PHOSPHATE 10 MG/ML IJ SOLN
INTRAMUSCULAR | Status: DC | PRN
  Administered 2014-04-16: 10 mg via INTRAVENOUS

## 2014-04-16 MED ORDER — ONDANSETRON HCL 4 MG/2ML IJ SOLN
INTRAMUSCULAR | Status: DC | PRN
  Administered 2014-04-16: 4 mg via INTRAVENOUS

## 2014-04-16 MED ORDER — FENTANYL CITRATE 0.05 MG/ML IJ SOLN
INTRAMUSCULAR | Status: AC
  Filled 2014-04-16: qty 2

## 2014-04-16 MED ORDER — FENTANYL CITRATE 0.05 MG/ML IJ SOLN
50.0000 ug | INTRAMUSCULAR | Status: DC | PRN
  Administered 2014-04-16: 50 ug via INTRAVENOUS

## 2014-04-16 MED ORDER — PROPOFOL 10 MG/ML IV BOLUS
INTRAVENOUS | Status: AC
  Filled 2014-04-16: qty 20

## 2014-04-16 MED ORDER — POVIDONE-IODINE 7.5 % EX SOLN
Freq: Once | CUTANEOUS | Status: DC
Start: 2014-04-16 — End: 2014-04-16

## 2014-04-16 MED ORDER — DOCUSATE SODIUM 100 MG PO CAPS: 100.0000 mg | ORAL_CAPSULE | Freq: Three times a day (TID) | ORAL | Status: DC | PRN

## 2014-04-16 SURGICAL SUPPLY — 84 items
APL SKNCLS STERI-STRIP NONHPOA (GAUZE/BANDAGES/DRESSINGS)
BANDAGE ELASTIC 4 VELCRO ST LF (GAUZE/BANDAGES/DRESSINGS) ×3 IMPLANT
BANDAGE ELASTIC 6 VELCRO ST LF (GAUZE/BANDAGES/DRESSINGS) ×2 IMPLANT
BANDAGE ESMARK 6X9 LF (GAUZE/BANDAGES/DRESSINGS) ×1 IMPLANT
BENZOIN TINCTURE PRP APPL 2/3 (GAUZE/BANDAGES/DRESSINGS) IMPLANT
BIT DRILL 110X2.5XQCK CNCT (BIT) IMPLANT
BIT DRILL 2.5 (BIT) ×3
BIT DRILL QC 110 3.5 (BIT) ×1
BIT DRILL QC 110 3.5MM (BIT) IMPLANT
BIT DRL 110X2.5XQCK CNCT (BIT) ×1
BLADE SURG 15 STRL LF DISP TIS (BLADE) ×2 IMPLANT
BLADE SURG 15 STRL SS (BLADE) ×6
BNDG CMPR 9X4 STRL LF SNTH (GAUZE/BANDAGES/DRESSINGS)
BNDG CMPR 9X6 STRL LF SNTH (GAUZE/BANDAGES/DRESSINGS) ×1
BNDG COHESIVE 4X5 TAN STRL (GAUZE/BANDAGES/DRESSINGS) ×3 IMPLANT
BNDG ESMARK 4X9 LF (GAUZE/BANDAGES/DRESSINGS) IMPLANT
BNDG ESMARK 6X9 LF (GAUZE/BANDAGES/DRESSINGS) ×3
CANISTER SUCT 1200ML W/VALVE (MISCELLANEOUS) IMPLANT
CHLORAPREP W/TINT 26ML (MISCELLANEOUS) ×3 IMPLANT
CLOSURE WOUND 1/2 X4 (GAUZE/BANDAGES/DRESSINGS)
COVER BACK TABLE 60X90IN (DRAPES) ×3 IMPLANT
DECANTER SPIKE VIAL GLASS SM (MISCELLANEOUS) IMPLANT
DRAPE C-ARM 42X72 X-RAY (DRAPES) ×3 IMPLANT
DRAPE C-ARMOR (DRAPES) ×3 IMPLANT
DRAPE EXTREMITY T 121X128X90 (DRAPE) ×3 IMPLANT
DRAPE U 20/CS (DRAPES) ×3 IMPLANT
DRAPE U-SHAPE 47X51 STRL (DRAPES) ×3 IMPLANT
DRILL BIT QC 110 3.5MM (BIT) ×3
DRSG EMULSION OIL 3X3 NADH (GAUZE/BANDAGES/DRESSINGS) ×3 IMPLANT
DRSG PAD ABDOMINAL 8X10 ST (GAUZE/BANDAGES/DRESSINGS) ×6 IMPLANT
ELECT REM PT RETURN 9FT ADLT (ELECTROSURGICAL) ×3
ELECTRODE REM PT RTRN 9FT ADLT (ELECTROSURGICAL) ×1 IMPLANT
GAUZE SPONGE 4X4 12PLY STRL (GAUZE/BANDAGES/DRESSINGS) ×3 IMPLANT
GAUZE SPONGE 4X4 16PLY XRAY LF (GAUZE/BANDAGES/DRESSINGS) IMPLANT
GLOVE BIO SURGEON STRL SZ 6.5 (GLOVE) ×1 IMPLANT
GLOVE BIO SURGEON STRL SZ7 (GLOVE) ×3 IMPLANT
GLOVE BIO SURGEON STRL SZ7.5 (GLOVE) ×5 IMPLANT
GLOVE BIO SURGEONS STRL SZ 6.5 (GLOVE) ×1
GLOVE BIOGEL PI IND STRL 7.0 (GLOVE) ×1 IMPLANT
GLOVE BIOGEL PI IND STRL 8 (GLOVE) ×1 IMPLANT
GLOVE BIOGEL PI INDICATOR 7.0 (GLOVE) ×4
GLOVE BIOGEL PI INDICATOR 8 (GLOVE) ×2
GOWN STRL REUS W/ TWL LRG LVL3 (GOWN DISPOSABLE) ×2 IMPLANT
GOWN STRL REUS W/ TWL XL LVL3 (GOWN DISPOSABLE) ×1 IMPLANT
GOWN STRL REUS W/TWL LRG LVL3 (GOWN DISPOSABLE) ×6
GOWN STRL REUS W/TWL XL LVL3 (GOWN DISPOSABLE) ×3
NEEDLE HYPO 22GX1.5 SAFETY (NEEDLE) ×2 IMPLANT
NS IRRIG 1000ML POUR BTL (IV SOLUTION) ×3 IMPLANT
PACK BASIN DAY SURGERY FS (CUSTOM PROCEDURE TRAY) ×3 IMPLANT
PAD CAST 4YDX4 CTTN HI CHSV (CAST SUPPLIES) ×1 IMPLANT
PADDING CAST ABS 4INX4YD NS (CAST SUPPLIES) ×2
PADDING CAST ABS COTTON 4X4 ST (CAST SUPPLIES) ×1 IMPLANT
PADDING CAST COTTON 4X4 STRL (CAST SUPPLIES) ×3
PADDING CAST COTTON 6X4 STRL (CAST SUPPLIES) ×2 IMPLANT
PENCIL BUTTON HOLSTER BLD 10FT (ELECTRODE) ×3 IMPLANT
PLATE 6HOLE 1/3 TUBULAR (Plate) ×2 IMPLANT
SCREW CANC 2.5XFT 16X4XST SM (Screw) IMPLANT
SCREW CANC 4.0X16 (Screw) ×3 IMPLANT
SCREW CANCELLOUS FT 4.0X14 (Screw) ×2 IMPLANT
SCREW CORT S/T 3.5X22 (Screw) ×2 IMPLANT
SCREW CORTICAL 3.5X12 (Screw) ×2 IMPLANT
SCREW CORTICAL 3.5X14 (Screw) ×4 IMPLANT
SPLINT FAST PLASTER 5X30 (CAST SUPPLIES)
SPLINT PLASTER CAST FAST 5X30 (CAST SUPPLIES) IMPLANT
SPONGE LAP 4X18 X RAY DECT (DISPOSABLE) ×2 IMPLANT
STAPLER VISISTAT (STAPLE) IMPLANT
STOCKINETTE 6  STRL (DRAPES) ×2
STOCKINETTE 6 STRL (DRAPES) ×1 IMPLANT
STRIP CLOSURE SKIN 1/2X4 (GAUZE/BANDAGES/DRESSINGS) IMPLANT
SUCTION FRAZIER TIP 10 FR DISP (SUCTIONS) IMPLANT
SUT ETHILON 3 0 PS 1 (SUTURE) ×3 IMPLANT
SUT ETHILON 4 0 PS 2 18 (SUTURE) IMPLANT
SUT MON AB 4-0 PC3 18 (SUTURE) IMPLANT
SUT VIC AB 2-0 SH 27 (SUTURE) ×3
SUT VIC AB 2-0 SH 27XBRD (SUTURE) ×1 IMPLANT
SUT VIC AB 3-0 FS2 27 (SUTURE) IMPLANT
SUT VICRYL 4-0 PS2 18IN ABS (SUTURE) IMPLANT
SYR 20CC LL (SYRINGE) ×2 IMPLANT
SYR BULB 3OZ (MISCELLANEOUS) ×3 IMPLANT
TOWEL OR NON WOVEN STRL DISP B (DISPOSABLE) ×3 IMPLANT
TUBE CONNECTING 20'X1/4 (TUBING) ×1
TUBE CONNECTING 20X1/4 (TUBING) ×1 IMPLANT
UNDERPAD 30X30 INCONTINENT (UNDERPADS AND DIAPERS) ×3 IMPLANT
YANKAUER SUCT BULB TIP NO VENT (SUCTIONS) ×3 IMPLANT

## 2014-04-16 NOTE — Discharge Instructions (Signed)
Discharge Instructions after Ankle Surgery   Keep the splint on until your first visit with Dr. Tamera Punt, it cannot get wet Do not put any weight on injured leg Elevate leg frequently and wiggle toes Pain medicine has been prescribed for you.  Use your medicine liberally over the first 48 hours, and then you can begin to taper your use. You may take Extra Strength Tylenol or Tylenol only in place of the pain pills. DO NOT take ANY nonsteroidal anti-inflammatory pain medications: Advil, Motrin, Ibuprofen, Aleve, Naproxen, or Narprosyn.   Take one aspirin a day for 2 weeks after surgery, unless you have an aspirin sensitivity/ allergy or asthma.   Please call [phone] during normal business hours or [phone] after hours for any problems. Including the following:  - excessive redness of the incisions - drainage for more than 4 days - fever of more than 101.5 F  *Please note that pain medications will not be refilled after hours or on weekends.     Post Anesthesia Home Care Instructions  Activity: Get plenty of rest for the remainder of the day. A responsible adult should stay with you for 24 hours following the procedure.  For the next 24 hours, DO NOT: -Drive a car -Paediatric nurse -Drink alcoholic beverages -Take any medication unless instructed by your physician -Make any legal decisions or sign important papers.  Meals: Start with liquid foods such as gelatin or soup. Progress to regular foods as tolerated. Avoid greasy, spicy, heavy foods. If nausea and/or vomiting occur, drink only clear liquids until the nausea and/or vomiting subsides. Call your physician if vomiting continues.  Special Instructions/Symptoms: Your throat may feel dry or sore from the anesthesia or the breathing tube placed in your throat during surgery. If this causes discomfort, gargle with warm salt water. The discomfort should disappear within 24 hours.

## 2014-04-16 NOTE — Progress Notes (Signed)
Pop block aborted due to pt intolerance Pt and Dr singer agreed to stop

## 2014-04-16 NOTE — Progress Notes (Signed)
Assisted Dr. Singer with right, ultrasound guided, popliteal block. Side rails up, monitors on throughout procedure. See vital signs in flow sheet. Tolerated Procedure well.  

## 2014-04-16 NOTE — Anesthesia Postprocedure Evaluation (Signed)
Anesthesia Post Note  Patient: Joann Hickman  Procedure(s) Performed: Procedure(s) (LRB): OPEN REDUCTION INTERNAL FIXATION (ORIF) DISTAL FIBULAR ANKLE FRACTURE (Right)  Anesthesia type: general  Patient location: PACU  Post pain: Pain level controlled  Post assessment: Patient's Cardiovascular Status Stable  Last Vitals:  Filed Vitals:   04/16/14 1430  BP:   Pulse: 89  Temp:   Resp: 14    Post vital signs: Reviewed and stable  Level of consciousness: sedated  Complications: No apparent anesthesia complications

## 2014-04-16 NOTE — H&P (Signed)
Joann Hickman is an 49 y.o. female.   Chief Complaint: R ankle fracture HPI: S/p fall with R ankle fracture.  Past Medical History  Diagnosis Date  . GERD (gastroesophageal reflux disease)   . Anxiety   . Depression   . Herpes     genital  . PONV (postoperative nausea and vomiting)     Past Surgical History  Procedure Laterality Date  . Tubal reversal  2003  . Vaginal hysterectomy  2010  . Bladder suspension  2010  . Tubal ligation    . Dilation and curettage of uterus  2008  . Diagnostic laparoscopy  2005  . Back surgery  2012    lumbar lam    History reviewed. No pertinent family history. Social History:  reports that she has never smoked. She does not have any smokeless tobacco history on file. She reports that she does not drink alcohol or use illicit drugs.  Allergies: No Known Allergies  Medications Prior to Admission  Medication Sig Dispense Refill  . docusate sodium (COLACE) 100 MG capsule Take 100 mg by mouth 2 (two) times daily.    Marland Kitchen escitalopram (LEXAPRO) 5 MG tablet Take 5 mg by mouth daily.    Marland Kitchen HYDROcodone-acetaminophen (NORCO) 5-325 MG per tablet Take 1-2 tablets by mouth every 6 (six) hours as needed for moderate pain. 15 tablet 0  . naproxen (NAPROSYN) 250 MG tablet Take 1 tablet (250 mg total) by mouth 2 (two) times daily with a meal. 30 tablet 0  . NEXIUM 24HR 20 MG capsule Take 20 mg by mouth daily.  6  . ondansetron (ZOFRAN ODT) 4 MG disintegrating tablet Take 1 tablet (4 mg total) by mouth every 8 (eight) hours as needed for nausea or vomiting. 10 tablet 0  . valACYclovir (VALTREX) 1000 MG tablet Take 500 mg by mouth daily.   6  . PROAIR HFA 108 (90 BASE) MCG/ACT inhaler Inhale 1 puff into the lungs every 4 (four) hours as needed.   2    No results found for this or any previous visit (from the past 48 hour(s)). No results found.  Review of Systems  All other systems reviewed and are negative.   Blood pressure 133/77, pulse 77, temperature 98.7  F (37.1 C), temperature source Oral, resp. rate 20, height 5\' 3"  (1.6 m), weight 89.359 kg (197 lb), SpO2 98 %. Physical Exam  Constitutional: She is oriented to person, place, and time. She appears well-developed and well-nourished.  HENT:  Head: Atraumatic.  Eyes: EOM are normal.  Cardiovascular: Intact distal pulses.   Respiratory: Effort normal.  Musculoskeletal:  R ankle splint intact, wiggles toes, NVID  Neurological: She is alert and oriented to person, place, and time.  Skin: Skin is warm and dry.  Psychiatric: She has a normal mood and affect.     Assessment/Plan R ankle unstable Weber B ankle fracture Plan ORIF Risks / benefits of surgery discussed Consent on chart  NPO for OR Preop antibiotics   Chaise Passarella WILLIAM 04/16/2014, 12:06 PM

## 2014-04-16 NOTE — Anesthesia Preprocedure Evaluation (Addendum)
Anesthesia Evaluation  Patient identified by MRN, date of birth, ID band Patient awake    Reviewed: Allergy & Precautions, H&P , NPO status , Patient's Chart, lab work & pertinent test results  History of Anesthesia Complications (+) PONV and history of anesthetic complications  Airway Mallampati: II  TM Distance: >3 FB Neck ROM: full    Dental  (+) Teeth Intact, Dental Advidsory Given   Pulmonary neg pulmonary ROS,  breath sounds clear to auscultation        Cardiovascular negative cardio ROS      Neuro/Psych PSYCHIATRIC DISORDERS negative neurological ROS     GI/Hepatic Neg liver ROS, GERD-  ,  Endo/Other  Morbid obesity  Renal/GU negative Renal ROS     Musculoskeletal   Abdominal   Peds  Hematology   Anesthesia Other Findings   Reproductive/Obstetrics negative OB ROS                            Anesthesia Physical Anesthesia Plan  ASA: III  Anesthesia Plan: General LMA   Post-op Pain Management: MAC Combined w/ Regional for Post-op pain   Induction:   Airway Management Planned:   Additional Equipment:   Intra-op Plan:   Post-operative Plan:   Informed Consent: I have reviewed the patients History and Physical, chart, labs and discussed the procedure including the risks, benefits and alternatives for the proposed anesthesia with the patient or authorized representative who has indicated his/her understanding and acceptance.   Dental Advisory Given  Plan Discussed with: Anesthesiologist, CRNA and Surgeon  Anesthesia Plan Comments:        Anesthesia Quick Evaluation

## 2014-04-16 NOTE — Op Note (Signed)
Procedure(s): OPEN REDUCTION INTERNAL FIXATION (ORIF) DISTAL FIBULAR ANKLE FRACTURE Procedure Note  Joann Hickman female 49 y.o. 04/16/2014  Procedure(s) and Anesthesia Type:    * OPEN REDUCTION INTERNAL FIXATION (ORIF) right DISTAL FIBULAR ANKLE FRACTURE - General  Surgeon(s) and Role:    * Tania Ade, MD - Primary   Indications:  49 y.o. female s/p fall with right ankle fracture. Indicated for surgery to promote anatomic restoration of joint.     Surgeon: Nita Sells   Assistants: Jeanmarie Hubert PA-C [facility] was present and scrubbed throughout the procedure and was essential in positioning, retraction, exposure, and closure)  Anesthesia: General endotracheal anesthesia     Procedure Detail  OPEN REDUCTION INTERNAL FIXATION (ORIF) DISTAL FIBULAR ANKLE FRACTURE  Findings: Anatomic reduction with one interfragmentary lag screw and a 6 hole one third tubular plate  Estimated Blood Loss:  Minimal         Drains: none  Blood Given: none         Specimens: none        Complications:  * No complications entered in OR log *         Disposition: PACU - hemodynamically stable.         Condition: stable    Procedure:  The patient was identified in the preoperative  holding area where I personally marked the operative site after  verifying site side and procedure with the patient. The patient was taken back  to the operating room where general anesthesia was induced without  Complication. The patient was placed in supine position with a bump under the operative hip. A non sterile tourniquet was applied to the thigh. The patient did receive IV antibiotics prior to the incision.   After the appropriate time-out, the limb was exsanguinated and the tourniquet was elevated to 350 mmHg.   A lateral incision was made over the fracture site and dissection was carried down the lateral fibula.  The fracture was a exposed and cleaned of hematoma.  Reduction was  carried out using reduction forceps and manipulation of the ankle.  An interfragmentary lag screw was placed.  A 6 hole plate was laid laterally and felt to be appropriate sized.  Holes proximal and distal to the fracture were sequentially drill, measured and filled with appropriate sized bicortical and unicortical screws.  Appropriate length and alignment were verified on fluoroscopic imaging.    The syndesmosis was stressed and felt to be intact.  The wounds were then copiously irrigated and closed in layers with 2-0 vicryl in a deep layer and 3-0 nylon for skin closure.  Sterile dressings were then applied and well padded well molded splint in a plantigrade position was applied.  The tourniquet was let down for total tourniquet time of approximately 40 minutes.  The patient was then allowed to awaken from [place], taken to the PACU in stable condition.  POSTOPERATIVE PLAN: The patient will be non-weightbearing on the operative Extremity and will follow up in 10-14 days for wound check.

## 2014-04-16 NOTE — Transfer of Care (Signed)
Immediate Anesthesia Transfer of Care Note  Patient: Joann Hickman  Procedure(s) Performed: Procedure(s) with comments: OPEN REDUCTION INTERNAL FIXATION (ORIF) DISTAL FIBULAR ANKLE FRACTURE (Right) - Open reduction internal fixation distal fibular ankle fracture  Patient Location: PACU  Anesthesia Type:General  Level of Consciousness: sedated  Airway & Oxygen Therapy: Patient Spontanous Breathing and Patient connected to face mask oxygen  Post-op Assessment: Report given to RN and Post -op Vital signs reviewed and stable  Post vital signs: Reviewed and stable  Last Vitals:  Filed Vitals:   04/16/14 1210  BP:   Pulse: 82  Temp:   Resp: 18    Complications: No apparent anesthesia complications

## 2014-04-22 ENCOUNTER — Encounter (HOSPITAL_BASED_OUTPATIENT_CLINIC_OR_DEPARTMENT_OTHER): Payer: Self-pay | Admitting: Orthopedic Surgery

## 2014-06-19 ENCOUNTER — Other Ambulatory Visit: Payer: Self-pay | Admitting: Orthopedic Surgery

## 2014-06-19 DIAGNOSIS — M25571 Pain in right ankle and joints of right foot: Secondary | ICD-10-CM

## 2014-06-20 ENCOUNTER — Ambulatory Visit
Admission: RE | Admit: 2014-06-20 | Discharge: 2014-06-20 | Disposition: A | Discharge status: Home / Self Care | Payer: 59 | Source: Ambulatory Visit | Attending: Orthopedic Surgery | Admitting: Orthopedic Surgery

## 2014-06-20 DIAGNOSIS — M25571 Pain in right ankle and joints of right foot: Secondary | ICD-10-CM

## 2014-06-20 MED ORDER — IOPAMIDOL (ISOVUE-300) INJECTION 61%
100.0000 mL | Freq: Once | INTRAVENOUS | Status: AC | PRN
  Administered 2014-06-20: 100 mL via INTRAVENOUS

## 2014-07-16 ENCOUNTER — Institutional Professional Consult (permissible substitution): Payer: 59 | Admitting: Critical Care Medicine

## 2014-11-08 ENCOUNTER — Other Ambulatory Visit: Payer: Self-pay | Admitting: Gastroenterology

## 2014-12-31 ENCOUNTER — Other Ambulatory Visit: Payer: Self-pay | Admitting: Obstetrics and Gynecology

## 2014-12-31 DIAGNOSIS — N63 Unspecified lump in unspecified breast: Secondary | ICD-10-CM

## 2014-12-31 DIAGNOSIS — Z872 Personal history of diseases of the skin and subcutaneous tissue: Secondary | ICD-10-CM

## 2015-01-08 ENCOUNTER — Ambulatory Visit
Admission: RE | Admit: 2015-01-08 | Discharge: 2015-01-08 | Disposition: A | Discharge status: Home / Self Care | Payer: BLUE CROSS/BLUE SHIELD | Source: Ambulatory Visit | Attending: Obstetrics and Gynecology | Admitting: Obstetrics and Gynecology

## 2015-01-08 ENCOUNTER — Other Ambulatory Visit: Payer: Self-pay | Admitting: Obstetrics and Gynecology

## 2015-01-08 DIAGNOSIS — Z872 Personal history of diseases of the skin and subcutaneous tissue: Secondary | ICD-10-CM

## 2015-01-13 ENCOUNTER — Other Ambulatory Visit: Payer: Self-pay | Admitting: Obstetrics and Gynecology

## 2015-01-13 DIAGNOSIS — Z872 Personal history of diseases of the skin and subcutaneous tissue: Secondary | ICD-10-CM

## 2015-01-17 ENCOUNTER — Ambulatory Visit
Admission: RE | Admit: 2015-01-17 | Discharge: 2015-01-17 | Disposition: A | Discharge status: Home / Self Care | Payer: BLUE CROSS/BLUE SHIELD | Source: Ambulatory Visit | Attending: Obstetrics and Gynecology | Admitting: Obstetrics and Gynecology

## 2015-01-17 DIAGNOSIS — Z872 Personal history of diseases of the skin and subcutaneous tissue: Secondary | ICD-10-CM

## 2015-03-31 ENCOUNTER — Encounter (HOSPITAL_COMMUNITY): Payer: Self-pay

## 2015-03-31 DIAGNOSIS — R0602 Shortness of breath: Secondary | ICD-10-CM | POA: Insufficient documentation

## 2015-03-31 DIAGNOSIS — R062 Wheezing: Secondary | ICD-10-CM | POA: Insufficient documentation

## 2015-03-31 DIAGNOSIS — R079 Chest pain, unspecified: Secondary | ICD-10-CM | POA: Insufficient documentation

## 2015-03-31 MED ORDER — ALBUTEROL SULFATE (2.5 MG/3ML) 0.083% IN NEBU
INHALATION_SOLUTION | RESPIRATORY_TRACT | Status: DC
Start: 2015-03-31 — End: 2015-04-01
  Filled 2015-03-31: qty 6

## 2015-03-31 MED ORDER — ALBUTEROL SULFATE (2.5 MG/3ML) 0.083% IN NEBU
5.0000 mg | INHALATION_SOLUTION | Freq: Once | RESPIRATORY_TRACT | Status: AC
  Administered 2015-03-31: 5 mg via RESPIRATORY_TRACT

## 2015-03-31 NOTE — ED Notes (Signed)
Pt has asthma and started having difficulty breathing Friday morning and has been having chest pain, wheezing. Was given steroid, phenergan, and rocephin Friday.

## 2015-04-01 ENCOUNTER — Emergency Department (HOSPITAL_COMMUNITY): Payer: Commercial Managed Care - HMO

## 2015-04-01 ENCOUNTER — Emergency Department (HOSPITAL_COMMUNITY)
Admission: EM | Admit: 2015-04-01 | Discharge: 2015-04-01 | Disposition: A | Discharge status: Home / Self Care | Payer: Commercial Managed Care - HMO | Attending: Emergency Medicine | Admitting: Emergency Medicine

## 2015-04-01 NOTE — ED Notes (Signed)
Pt left without being seen.

## 2015-08-23 ENCOUNTER — Emergency Department (HOSPITAL_COMMUNITY): Payer: No Typology Code available for payment source

## 2015-08-23 ENCOUNTER — Encounter (HOSPITAL_COMMUNITY): Payer: Self-pay | Admitting: Emergency Medicine

## 2015-08-23 ENCOUNTER — Emergency Department (HOSPITAL_COMMUNITY)
Admission: EM | Admit: 2015-08-23 | Discharge: 2015-08-23 | Disposition: A | Discharge status: Home / Self Care | Payer: No Typology Code available for payment source | Attending: Emergency Medicine | Admitting: Emergency Medicine

## 2015-08-23 DIAGNOSIS — S6992XA Unspecified injury of left wrist, hand and finger(s), initial encounter: Secondary | ICD-10-CM | POA: Diagnosis present

## 2015-08-23 DIAGNOSIS — Y999 Unspecified external cause status: Secondary | ICD-10-CM | POA: Insufficient documentation

## 2015-08-23 DIAGNOSIS — Y939 Activity, unspecified: Secondary | ICD-10-CM | POA: Insufficient documentation

## 2015-08-23 DIAGNOSIS — S61219A Laceration without foreign body of unspecified finger without damage to nail, initial encounter: Secondary | ICD-10-CM

## 2015-08-23 DIAGNOSIS — F329 Major depressive disorder, single episode, unspecified: Secondary | ICD-10-CM | POA: Diagnosis not present

## 2015-08-23 DIAGNOSIS — W260XXA Contact with knife, initial encounter: Secondary | ICD-10-CM | POA: Diagnosis not present

## 2015-08-23 DIAGNOSIS — S61211A Laceration without foreign body of left index finger without damage to nail, initial encounter: Secondary | ICD-10-CM | POA: Insufficient documentation

## 2015-08-23 DIAGNOSIS — Z79899 Other long term (current) drug therapy: Secondary | ICD-10-CM | POA: Insufficient documentation

## 2015-08-23 DIAGNOSIS — Y929 Unspecified place or not applicable: Secondary | ICD-10-CM | POA: Diagnosis not present

## 2015-08-23 HISTORY — DX: Urinary tract infection, site not specified: N39.0

## 2015-08-23 MED ORDER — LIDOCAINE HCL (PF) 2 % IJ SOLN
10.0000 mL | Freq: Once | INTRAMUSCULAR | Status: AC
  Administered 2015-08-23: 10 mL
  Filled 2015-08-23: qty 10

## 2015-08-23 MED ORDER — ONDANSETRON 8 MG PO TBDP
ORAL_TABLET | ORAL | Status: AC
  Administered 2015-08-23: 16:00:00
  Filled 2015-08-23: qty 1

## 2015-08-23 NOTE — Discharge Instructions (Signed)

## 2015-08-23 NOTE — ED Notes (Signed)
Patient verbalizes understanding of discharge instructions, home care and follow up care. Patient out of department at this time. 

## 2015-08-23 NOTE — ED Notes (Signed)
Patient has laceration to left middle finger. Per patient cut finger at work with knife. Patient's last vaccination approx 5 years ago.

## 2015-08-24 NOTE — ED Provider Notes (Signed)
CSN: [id number]     Arrival date & time 08/23/15  1426 History   First MD Initiated Contact with Patient 08/23/15 1541     Chief Complaint  Patient presents with  . Laceration     (Consider location/radiation/quality/duration/timing/severity/associated sxs/prior Treatment) Patient is a 50 y.o. female presenting with skin laceration. The history is provided by the patient.  Laceration Location:  Hand Hand laceration location:  L finger Length (cm):  1 cm Depth:  Through dermis Quality: straight   Bleeding: controlled with pressure   Time since incident:  30 minutes Laceration mechanism:  Knife Pain details:    Quality:  Aching   Severity:  Mild   Progression:  Unchanged Foreign body present:  No foreign bodies Relieved by:  Nothing Worsened by:  Nothing tried Ineffective treatments:  None tried Tetanus status:  Up to date   Past Medical History  Diagnosis Date  . GERD (gastroesophageal reflux disease)   . Anxiety   . Depression   . Herpes     genital  . PONV (postoperative nausea and vomiting)   . UTI (lower urinary tract infection)    Past Surgical History  Procedure Laterality Date  . Tubal reversal  2003  . Vaginal hysterectomy  2010  . Bladder suspension  2010  . Tubal ligation    . Dilation and curettage of uterus  2008  . Diagnostic laparoscopy  2005  . Back surgery  2012    lumbar lam  . Orif ankle fracture Right 04/16/2014    Procedure: OPEN REDUCTION INTERNAL FIXATION (ORIF) DISTAL FIBULAR ANKLE FRACTURE;  Surgeon: Tania Ade, MD;  Location: [facility];  Service: Orthopedics;  Laterality: Right;  Open reduction internal fixation distal fibular ankle fracture   History reviewed. No pertinent family history. Social History  Substance Use Topics  . Smoking status: Never Smoker   . Smokeless tobacco: Never Used  . Alcohol Use: No   OB History    Gravida Para Term Preterm AB TAB SAB Ectopic Multiple Living   2 2 2       2       Review of Systems  Constitutional: Negative.   Respiratory: Negative.   Cardiovascular: Negative.   Skin: Positive for wound.  Neurological: Negative for numbness.      Allergies  Hydrocodone-guaifenesin  Home Medications   Prior to Admission medications   Medication Sig Start Date End Date Taking? Authorizing Provider  escitalopram (LEXAPRO) 5 MG tablet Take 5 mg by mouth daily.   Yes Historical Provider, MD  meloxicam (MOBIC) 15 MG tablet TAKE 1 TABLET BY MOUTH EVERY DAY AS NEEDED WITH FOOD 06/26/15  Yes Historical Provider, MD  NEXIUM 24HR 20 MG capsule Take 20 mg by mouth daily. 03/18/14  Yes Historical Provider, MD  nitrofurantoin, macrocrystal-monohydrate, (MACROBID) 100 MG capsule Take 100 mg by mouth 2 (two) times daily.  08/20/15  Yes Historical Provider, MD  PROAIR HFA 108 (90 BASE) MCG/ACT inhaler Inhale 1 puff into the lungs every 4 (four) hours as needed.  01/17/14  Yes Historical Provider, MD  valACYclovir (VALTREX) 1000 MG tablet Take 500 mg by mouth daily.  03/26/14  Yes Historical Provider, MD   BP 121/81 mmHg  Pulse 100  Temp(Src) 98.6 F (37 C) (Oral)  Resp 18  Ht 5\' 3"  (1.6 m)  Wt 88.27 kg  BMI 34.48 kg/m2  SpO2 100% Physical Exam  Constitutional: She is oriented to person, place, and time. She appears well-developed and well-nourished.  HENT:  Head: Normocephalic.  Cardiovascular: Normal rate.   Less than 2 sec cap refill in distal digit.  Pulmonary/Chest: Effort normal.  Neurological: She is alert and oriented to person, place, and time. No sensory deficit.  Sensation intact in fingers.  Skin: Laceration noted.  1 cm c shaped laceration along left long finger at nail edge, not involving nail plate.    ED Course  Procedures (including critical care time)  LACERATION REPAIR Performed by: Evalee Jefferson Authorized by: Evalee Jefferson Consent: Verbal consent obtained. Risks and benefits: risks, benefits and alternatives were discussed Consent given by:  patient Patient identity confirmed: provided demographic data Prepped and Draped in normal sterile fashion Wound explored  Laceration Location: left distal long finger  Laceration Length: 1cm  No Foreign Bodies seen or palpated  Anesthesia: local infiltration  Local anesthetic: lidocaine 2% without epinephrine  Anesthetic total: 2 ml  Irrigation method: syringe Amount of cleaning: standard  Skin closure: prolene 4-0  Number of sutures: 3  Technique: simple interupted  Patient tolerance: Patient tolerated the procedure well with no immediate complications.  She became nauseated at time of digital block.  Gave zofran odt with relief of sx.  Labs Review Labs Reviewed - No data to display  Imaging Review Dg Finger Middle Left  08/23/2015  CLINICAL DATA:  Laceration of the left middle finger. EXAM: LEFT MIDDLE FINGER 2+V COMPARISON:  None. FINDINGS: There is no evidence of fracture or dislocation. There is no evidence of arthropathy or other focal bone abnormality. Soft tissues are unremarkable. IMPRESSION: Negative. Electronically Signed   By: Kathreen Devoid   On: 08/23/2015 15:28   I have personally reviewed and evaluated these images and lab results as part of my medical decision-making.   EKG Interpretation None      MDM   Final diagnoses:  Laceration of finger, initial encounter    Wound care instructions given.  Pt advised to have sutures removed in 10 days,  Return here sooner for any signs of infection including redness, swelling, worse pain or drainage of pus.       Evalee Jefferson, PA-C 08/24/15 [place], MD 08/24/15 [phone]

## 2016-01-21 ENCOUNTER — Emergency Department (HOSPITAL_COMMUNITY): Payer: Commercial Managed Care - HMO

## 2016-01-21 ENCOUNTER — Encounter (HOSPITAL_COMMUNITY): Payer: Self-pay | Admitting: Emergency Medicine

## 2016-01-21 ENCOUNTER — Emergency Department (HOSPITAL_COMMUNITY)
Admission: EM | Admit: 2016-01-21 | Discharge: 2016-01-21 | Disposition: A | Discharge status: Home / Self Care | Payer: Commercial Managed Care - HMO | Attending: Emergency Medicine | Admitting: Emergency Medicine

## 2016-01-21 DIAGNOSIS — Z79899 Other long term (current) drug therapy: Secondary | ICD-10-CM | POA: Insufficient documentation

## 2016-01-21 DIAGNOSIS — R1031 Right lower quadrant pain: Secondary | ICD-10-CM | POA: Insufficient documentation

## 2016-01-21 LAB — COMPREHENSIVE METABOLIC PANEL
ALBUMIN: 4.3 g/dL (ref 3.5–5.0)
ALK PHOS: 60 U/L (ref 38–126)
ALT: 15 U/L (ref 14–54)
AST: 15 U/L (ref 15–41)
Anion gap: 6 (ref 5–15)
BILIRUBIN TOTAL: 0.9 mg/dL (ref 0.3–1.2)
BUN: 14 mg/dL (ref 6–20)
CO2: 27 mmol/L (ref 22–32)
CREATININE: 0.93 mg/dL (ref 0.44–1.00)
Calcium: 8.8 mg/dL — ABNORMAL LOW (ref 8.9–10.3)
Chloride: 105 mmol/L (ref 101–111)
GFR calc Af Amer: >60 mL/min (ref >60)
GLUCOSE: 99 mg/dL (ref 65–99)
Potassium: 4.4 mmol/L (ref 3.5–5.1)
Sodium: 138 mmol/L (ref 135–145)
TOTAL PROTEIN: 7.7 g/dL (ref 6.5–8.1)

## 2016-01-21 LAB — URINALYSIS, ROUTINE W REFLEX MICROSCOPIC
BILIRUBIN URINE: NEGATIVE
Glucose, UA: NEGATIVE mg/dL
Ketones, ur: NEGATIVE mg/dL
LEUKOCYTES UA: NEGATIVE
NITRITE: NEGATIVE
PH: 5 (ref 5.0–8.0)
Protein, ur: NEGATIVE mg/dL
SPECIFIC GRAVITY, URINE: 1.017 (ref 1.005–1.030)

## 2016-01-21 LAB — CBC
HCT: 39.7 % (ref 36.0–46.0)
Hemoglobin: 14.4 g/dL (ref 12.0–15.0)
MCH: 31.9 pg (ref 26.0–34.0)
MCHC: 36.3 g/dL — AB (ref 30.0–36.0)
MCV: 87.8 fL (ref 78.0–100.0)
PLATELETS: 274 10*3/uL (ref 150–400)
RBC: 4.52 MIL/uL (ref 3.87–5.11)
RDW: 11.4 % — AB (ref 11.5–15.5)
WBC: 9.1 10*3/uL (ref 4.0–10.5)

## 2016-01-21 LAB — LIPASE, BLOOD: Lipase: 39 U/L (ref 11–51)

## 2016-01-21 MED ORDER — IOPAMIDOL (ISOVUE-300) INJECTION 61%
INTRAVENOUS | Status: AC
  Administered 2016-01-21: 100 mL
  Filled 2016-01-21: qty 100

## 2016-01-21 NOTE — ED Provider Notes (Signed)
[facility] DEPT Provider Note   CSN: [id number] Arrival date & time: 01/21/16  1134     History   Chief Complaint Chief Complaint  Patient presents with  . Abdominal Pain  . Back Pain  . Hematuria    seen and referred  by Dr Orvan Seen 1000   . Flank Pain  . Nausea  . Emesis    HPI AVALENA GUSTUS is a 50 y.o. female.Patient complains of right lower quadrant abdominal pain radiating to right flank which started 3 or 4 days ago symptoms accompanied by nausea no vomiting no fever she states presently she feels nauseated but also feels hungry. Other associated symptoms include hematuria. HPI  seen by Dr. Orvan Seen, her GYN this morning reports had GYN ultrasound which was unremarkable and here for further evaluation. Pain made worse with pressing on area not made better with anything. Treated with Toradol at office this morning with improvement of discomfort. Has only feels pain is under control.  Past Medical History:  Diagnosis Date  . Anxiety   . Depression   . GERD (gastroesophageal reflux disease)   . Herpes    genital  . PONV (postoperative nausea and vomiting)   . UTI (lower urinary tract infection)     There are no active problems to display for this patient.   Past Surgical History:  Procedure Laterality Date  . BACK SURGERY  2012   lumbar lam  . BLADDER SUSPENSION  2010  . DIAGNOSTIC LAPAROSCOPY  2005  . DILATION AND CURETTAGE OF UTERUS  2008  . ORIF ANKLE FRACTURE Right 04/16/2014   Procedure: OPEN REDUCTION INTERNAL FIXATION (ORIF) DISTAL FIBULAR ANKLE FRACTURE;  Surgeon: Tania Ade, MD;  Location: [facility];  Service: Orthopedics;  Laterality: Right;  Open reduction internal fixation distal fibular ankle fracture  . TUBAL LIGATION    . tubal reversal  2003  . VAGINAL HYSTERECTOMY  2010    OB History    Gravida Para Term Preterm AB Living   2 2 2     2    SAB TAB Ectopic Multiple Live Births                   Home Medications     Prior to Admission medications   Medication Sig Start Date End Date Taking? Authorizing Provider  escitalopram (LEXAPRO) 5 MG tablet Take 5 mg by mouth daily.   Yes Historical Provider, MD  NEXIUM 24HR 20 MG capsule Take 20 mg by mouth daily. 03/18/14  Yes Historical Provider, MD  PROAIR HFA 108 (90 BASE) MCG/ACT inhaler Inhale 1 puff into the lungs every 4 (four) hours as needed.  01/17/14  Yes Historical Provider, MD  valACYclovir (VALTREX) 1000 MG tablet Take 500 mg by mouth daily as needed (flare ups).  03/26/14  Yes Historical Provider, MD    Family History Family History  Problem Relation Age of Onset  . Hypertension Mother   . Diabetes Mother   . Cancer Mother   . Hypertension Father   . Cancer Father     Social History Social History  Substance Use Topics  . Smoking status: Never Smoker  . Smokeless tobacco: Never Used  . Alcohol use Yes     Comment: occ     Allergies   Hydrocodone-guaifenesin   Review of Systems Review of Systems  Constitutional: Negative.   HENT: Negative.   Respiratory: Negative.   Cardiovascular: Negative.   Gastrointestinal: Positive for abdominal pain and nausea.  Genitourinary:  Positive for flank pain and hematuria.  Skin: Negative.   Neurological: Negative.   Psychiatric/Behavioral: Negative.   All other systems reviewed and are negative.    Physical Exam Updated Vital Signs BP 145/98 (BP Location: Left Arm)   Pulse 63   Temp 97.9 F (36.6 C) (Oral)   Resp 18   Wt 193 lb (87.5 kg)   SpO2 100%   BMI 34.19 kg/m   Physical Exam  Constitutional: She appears well-developed and well-nourished.  HENT:  Head: Normocephalic and atraumatic.  Eyes: Conjunctivae are normal. Pupils are equal, round, and reactive to light.  Neck: Neck supple. No tracheal deviation present. No thyromegaly present.  Cardiovascular: Normal rate and regular rhythm.   No murmur heard. Pulmonary/Chest: Effort normal and breath sounds normal.   Abdominal: Soft. Bowel sounds are normal. She exhibits no distension. There is no tenderness.  Tender at right lower quadrant  Genitourinary:  Genitourinary Comments: Right flank tenderness  Musculoskeletal: Normal range of motion. She exhibits no edema or tenderness.  Neurological: She is alert. Coordination normal.  Skin: Skin is warm and dry. No rash noted.  Psychiatric: She has a normal mood and affect.  Nursing note and vitals reviewed.    ED Treatments / Results  Labs (all labs ordered are listed, but only abnormal results are displayed) Labs Reviewed  COMPREHENSIVE METABOLIC PANEL - Abnormal; Notable for the following:       Result Value   Calcium 8.8 (*)    All other components within normal limits  CBC - Abnormal; Notable for the following:    MCHC 36.3 (*)    RDW 11.4 (*)    All other components within normal limits  URINALYSIS, ROUTINE W REFLEX MICROSCOPIC - Abnormal; Notable for the following:    APPearance HAZY (*)    Hgb urine dipstick MODERATE (*)    Bacteria, UA RARE (*)    Squamous Epithelial / LPF 0-5 (*)    All other components within normal limits  LIPASE, BLOOD    EKG  EKG Interpretation None       Radiology No results found.  Procedures Procedures (including critical care time)  Medications Ordered in ED Medications - No data to display Results for orders placed or performed during the hospital encounter of 01/21/16  Lipase, blood  Result Value Ref Range   Lipase 39 11 - 51 U/L  Comprehensive metabolic panel  Result Value Ref Range   Sodium 138 135 - 145 mmol/L   Potassium 4.4 3.5 - 5.1 mmol/L   Chloride 105 101 - 111 mmol/L   CO2 27 22 - 32 mmol/L   Glucose, Bld 99 65 - 99 mg/dL   BUN 14 6 - 20 mg/dL   Creatinine, Ser 0.93 0.44 - 1.00 mg/dL   Calcium 8.8 (L) 8.9 - 10.3 mg/dL   Total Protein 7.7 6.5 - 8.1 g/dL   Albumin 4.3 3.5 - 5.0 g/dL   AST 15 15 - 41 U/L   ALT 15 14 - 54 U/L   Alkaline Phosphatase 60 38 - 126 U/L   Total  Bilirubin 0.9 0.3 - 1.2 mg/dL   GFR calc non Af Amer >60 >60 mL/min   GFR calc Af Amer >60 >60 mL/min   Anion gap 6 5 - 15  CBC  Result Value Ref Range   WBC 9.1 4.0 - 10.5 K/uL   RBC 4.52 3.87 - 5.11 MIL/uL   Hemoglobin 14.4 12.0 - 15.0 g/dL   HCT 39.7 36.0 -  46.0 %   MCV 87.8 78.0 - 100.0 fL   MCH 31.9 26.0 - 34.0 pg   MCHC 36.3 (H) 30.0 - 36.0 g/dL   RDW 11.4 (L) 11.5 - 15.5 %   Platelets 274 150 - 400 K/uL  Urinalysis, Routine w reflex microscopic  Result Value Ref Range   Color, Urine YELLOW YELLOW   APPearance HAZY (A) CLEAR   Specific Gravity, Urine 1.017 1.005 - 1.030   pH 5.0 5.0 - 8.0   Glucose, UA NEGATIVE NEGATIVE mg/dL   Hgb urine dipstick MODERATE (A) NEGATIVE   Bilirubin Urine NEGATIVE NEGATIVE   Ketones, ur NEGATIVE NEGATIVE mg/dL   Protein, ur NEGATIVE NEGATIVE mg/dL   Nitrite NEGATIVE NEGATIVE   Leukocytes, UA NEGATIVE NEGATIVE   RBC / HPF 0-5 0 - 5 RBC/hpf   WBC, UA 0-5 0 - 5 WBC/hpf   Bacteria, UA RARE (A) NONE SEEN   Squamous Epithelial / LPF 0-5 (A) NONE SEEN   Mucous PRESENT    Ct Abdomen Pelvis W Contrast  Result Date: 01/21/2016 CLINICAL DATA:  Right lower quadrant pain and tenderness with nausea for 3-4 days. EXAM: CT ABDOMEN AND PELVIS WITH CONTRAST TECHNIQUE: Multidetector CT imaging of the abdomen and pelvis was performed using the standard protocol following bolus administration of intravenous contrast. CONTRAST:  100 ISOVUE-300 IOPAMIDOL (ISOVUE-300) INJECTION 61% COMPARISON:  01/18/2013. FINDINGS: Lower chest: Lung bases show no acute findings. Small subpleural lymph nodes in the right middle lobe. Heart size normal. No pericardial or pleural effusion. Hepatobiliary: Liver and gallbladder are unremarkable. No biliary ductal dilatation. Pancreas: Negative. Spleen: Negative. Adrenals/Urinary Tract: Adrenal glands and kidneys are unremarkable. Ureters are decompressed. Bladder is low in volume. Stomach/Bowel: Stomach, small bowel, appendix and colon  are unremarkable. Vascular/Lymphatic: Atherosclerotic calcification of the arterial vasculature without abdominal aortic aneurysm. No pathologically enlarged lymph nodes. Reproductive: Hysterectomy.  No adnexal mass. Other: Trace pelvic free fluid or soft tissue thickening. Mesenteries and peritoneum are otherwise unremarkable. Left hemidiaphragm is elevated. Musculoskeletal: No worrisome lytic or sclerotic lesions. IMPRESSION: 1. No findings to explain the patient's symptoms. 2.  Aortic atherosclerosis (ICD10-170.0). Electronically Signed   By: Lorin Picket M.D.   On: 01/21/2016 15:32    Initial Impression / Assessment and Plan / ED Course  I have reviewed the triage vital signs and the nursing notes.  Pertinent labs & imaging results that were available during my care of the patient were reviewed by me and considered in my medical decision making (see chart for details).  Clinical Course     4 PM patient asymptomatic, pain-free. Feels ready for discharge I suspect the patient may have had a ureteral stone which passed spontaneously. Plan return as needed or see PMD blood pressure recheck 3 weeks Final Clinical Impressions(s) / ED Diagnoses  Diagnosis#1 right lower quadrant abdominal pain #2 elevated blood pressure Final diagnoses:  None    New Prescriptions New Prescriptions   No medications on file     Orlie Dakin, MD 01/21/16 [phone]

## 2016-01-21 NOTE — Discharge Instructions (Signed)
Return if concerned for any reason or see your primary care physician. Your blood pressure should be rechecked within the next 3 weeks that your primary care physician's office. Today's was mildly elevated at 144/85

## 2016-01-21 NOTE — ED Notes (Signed)
Patient is A & O x4.  She understood discharge instructions. 

## 2016-01-21 NOTE — ED Triage Notes (Signed)
Pt was seen by Dr. Orvan Seen at 1000 for c/o abdominal and flank pain. Hematuria noted by MD. Transvaginal ultrasound completed. Pt referred to ED for CT scan. To r/o appendicitis, kidney stone and UTI. Pt is alert oriented, coopreative. C/o nausea, no vomiting at present. Received Toradol 60mg  IM at the office. Husband at bedside

## 2016-02-13 ENCOUNTER — Ambulatory Visit
Admission: RE | Admit: 2016-02-13 | Discharge: 2016-02-13 | Disposition: A | Discharge status: Home / Self Care | Payer: Commercial Managed Care - HMO | Source: Ambulatory Visit | Attending: Nurse Practitioner | Admitting: Nurse Practitioner

## 2016-02-13 ENCOUNTER — Other Ambulatory Visit: Payer: Self-pay | Admitting: Nurse Practitioner

## 2016-02-13 DIAGNOSIS — K5901 Slow transit constipation: Secondary | ICD-10-CM

## 2016-02-13 DIAGNOSIS — K5900 Constipation, unspecified: Secondary | ICD-10-CM | POA: Diagnosis not present

## 2016-02-23 DIAGNOSIS — Z01419 Encounter for gynecological examination (general) (routine) without abnormal findings: Secondary | ICD-10-CM | POA: Diagnosis not present

## 2016-03-05 DIAGNOSIS — K5900 Constipation, unspecified: Secondary | ICD-10-CM | POA: Diagnosis not present

## 2016-05-24 DIAGNOSIS — L820 Inflamed seborrheic keratosis: Secondary | ICD-10-CM | POA: Diagnosis not present

## 2016-05-24 DIAGNOSIS — D2261 Melanocytic nevi of right upper limb, including shoulder: Secondary | ICD-10-CM | POA: Diagnosis not present

## 2016-05-24 DIAGNOSIS — D1801 Hemangioma of skin and subcutaneous tissue: Secondary | ICD-10-CM | POA: Diagnosis not present

## 2016-05-24 DIAGNOSIS — D2371 Other benign neoplasm of skin of right lower limb, including hip: Secondary | ICD-10-CM | POA: Diagnosis not present

## 2016-05-24 DIAGNOSIS — D2239 Melanocytic nevi of other parts of face: Secondary | ICD-10-CM | POA: Diagnosis not present

## 2016-05-24 DIAGNOSIS — D485 Neoplasm of uncertain behavior of skin: Secondary | ICD-10-CM | POA: Diagnosis not present

## 2016-05-24 DIAGNOSIS — L821 Other seborrheic keratosis: Secondary | ICD-10-CM | POA: Diagnosis not present

## 2016-06-08 DIAGNOSIS — L988 Other specified disorders of the skin and subcutaneous tissue: Secondary | ICD-10-CM | POA: Diagnosis not present

## 2016-06-08 DIAGNOSIS — D485 Neoplasm of uncertain behavior of skin: Secondary | ICD-10-CM | POA: Diagnosis not present

## 2016-07-26 DIAGNOSIS — L299 Pruritus, unspecified: Secondary | ICD-10-CM | POA: Diagnosis not present

## 2016-07-26 DIAGNOSIS — L255 Unspecified contact dermatitis due to plants, except food: Secondary | ICD-10-CM | POA: Diagnosis not present

## 2016-07-29 DIAGNOSIS — J322 Chronic ethmoidal sinusitis: Secondary | ICD-10-CM | POA: Diagnosis not present

## 2016-09-06 DIAGNOSIS — J322 Chronic ethmoidal sinusitis: Secondary | ICD-10-CM | POA: Diagnosis not present

## 2016-09-06 DIAGNOSIS — R030 Elevated blood-pressure reading, without diagnosis of hypertension: Secondary | ICD-10-CM | POA: Diagnosis not present

## 2016-10-06 DIAGNOSIS — Z0000 Encounter for general adult medical examination without abnormal findings: Secondary | ICD-10-CM | POA: Diagnosis not present

## 2016-10-06 DIAGNOSIS — Z136 Encounter for screening for cardiovascular disorders: Secondary | ICD-10-CM | POA: Diagnosis not present

## 2016-10-06 DIAGNOSIS — Z79899 Other long term (current) drug therapy: Secondary | ICD-10-CM | POA: Diagnosis not present

## 2016-12-02 DIAGNOSIS — L218 Other seborrheic dermatitis: Secondary | ICD-10-CM | POA: Diagnosis not present

## 2016-12-03 DIAGNOSIS — J0100 Acute maxillary sinusitis, unspecified: Secondary | ICD-10-CM | POA: Diagnosis not present

## 2017-01-12 DIAGNOSIS — E559 Vitamin D deficiency, unspecified: Secondary | ICD-10-CM | POA: Diagnosis not present

## 2017-01-19 IMAGING — US US BREAST LTD UNI RIGHT INC AXILLA
1 series · 5 of 5 positions shown · non-contrast
Comparison: Previous exam(s).

CLINICAL DATA: 49-year-old female presenting for evaluation of an
area of dimpling in the lateral right breast, present for
approximately 6 months.

EXAM:
DIGITAL DIAGNOSTIC RIGHT MAMMOGRAM WITH 3D TOMOSYNTHESIS AND CAD
RIGHT BREAST ULTRASOUND

[Series 1: us breast ltd uni right inc axilla · 0.06mm/px · 5 of 5 slices shown]
[im 1/5]
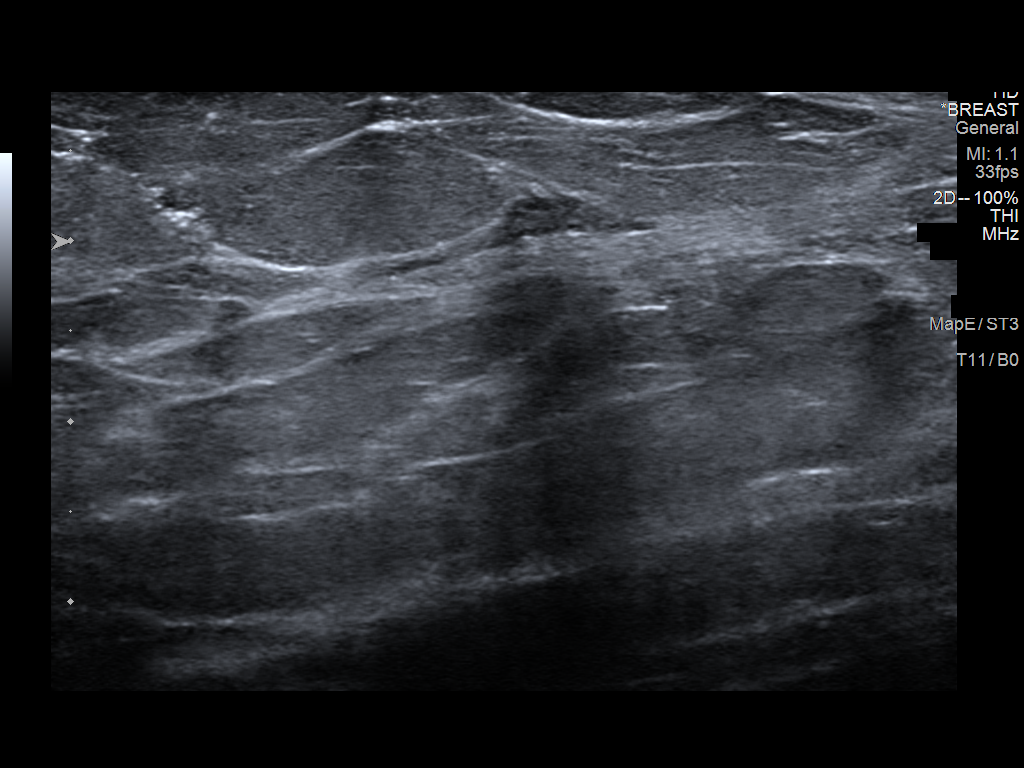
[im 2/5]
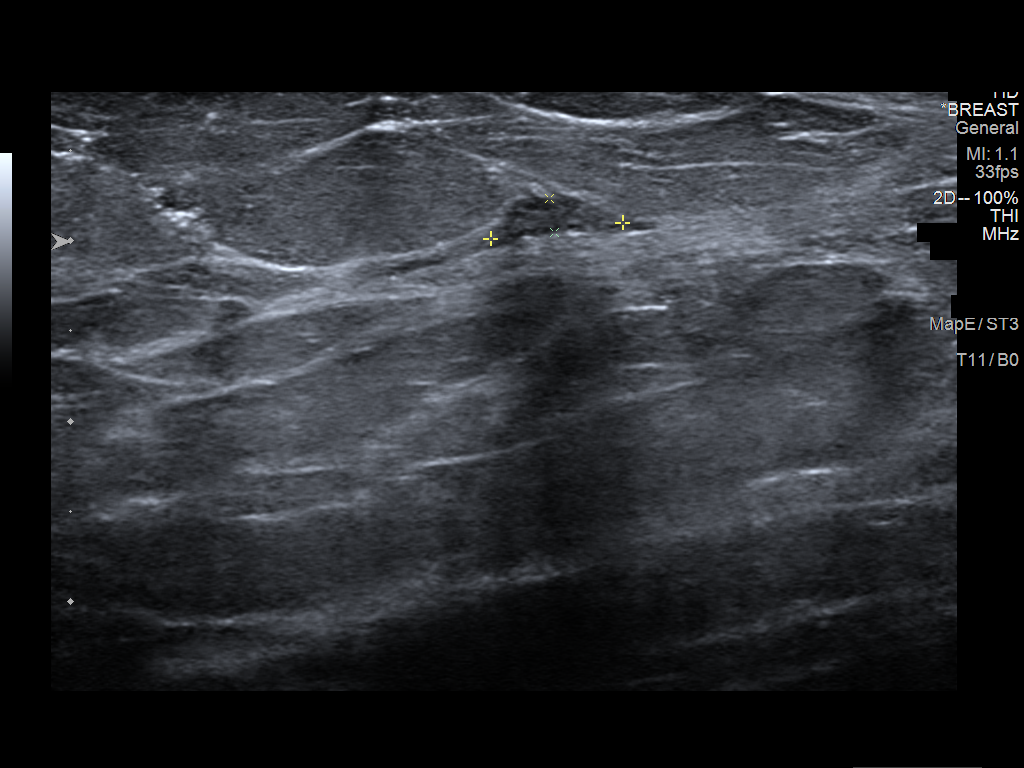
[im 3/5]
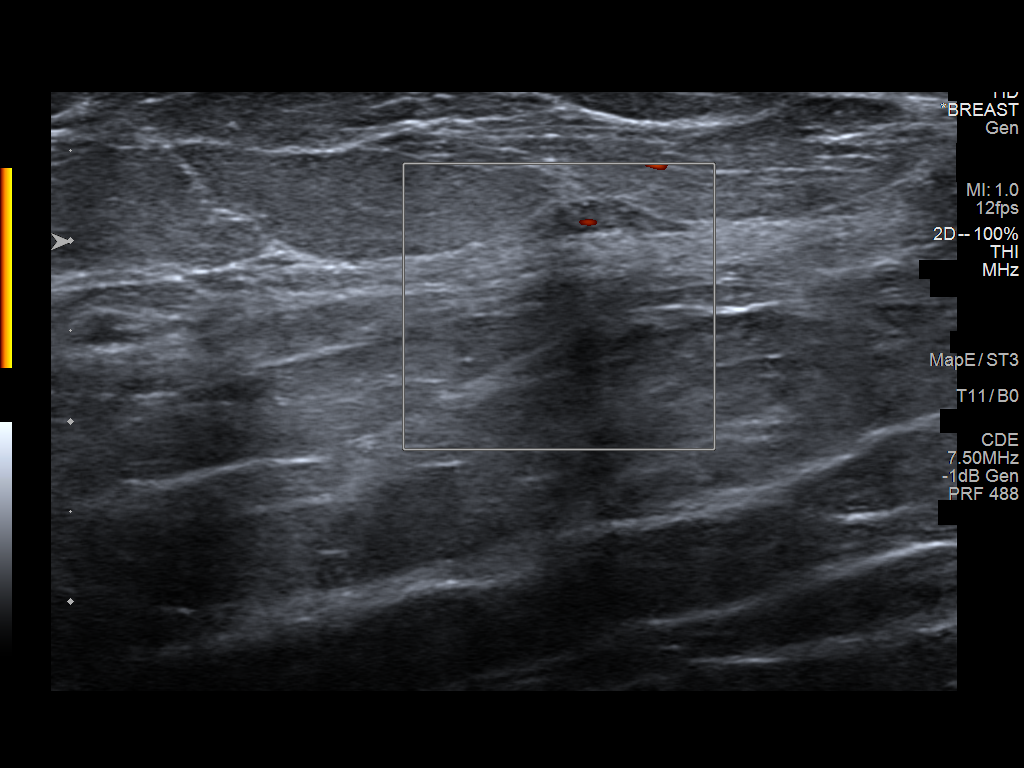
[im 4/5]
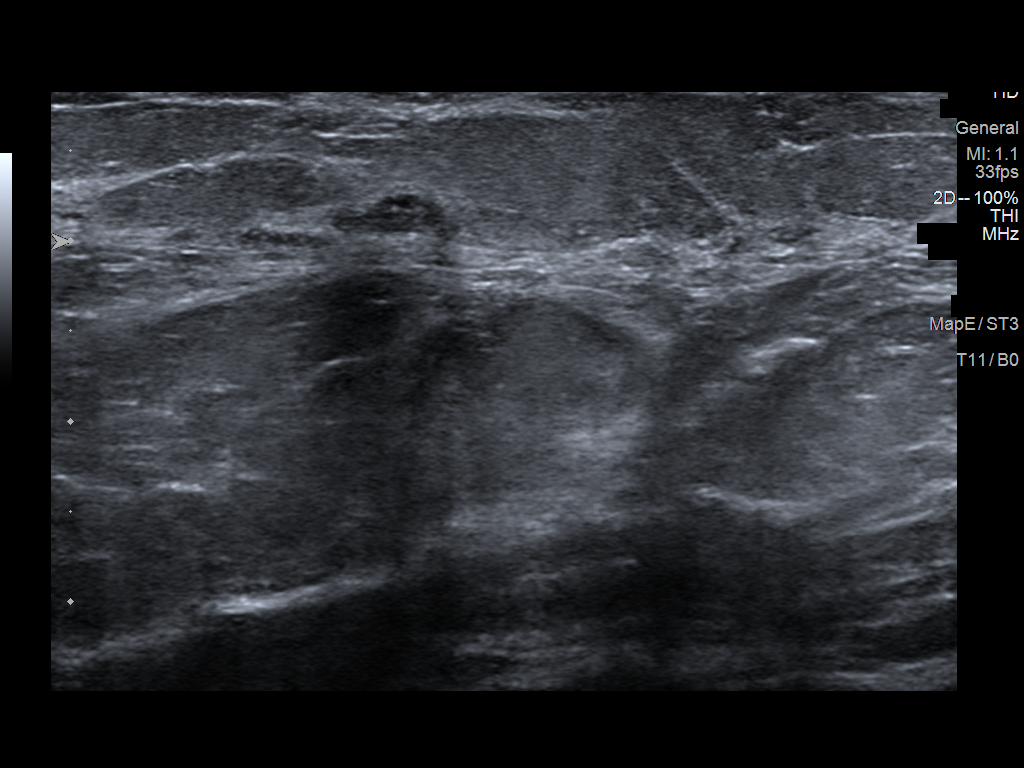
[im 5/5]
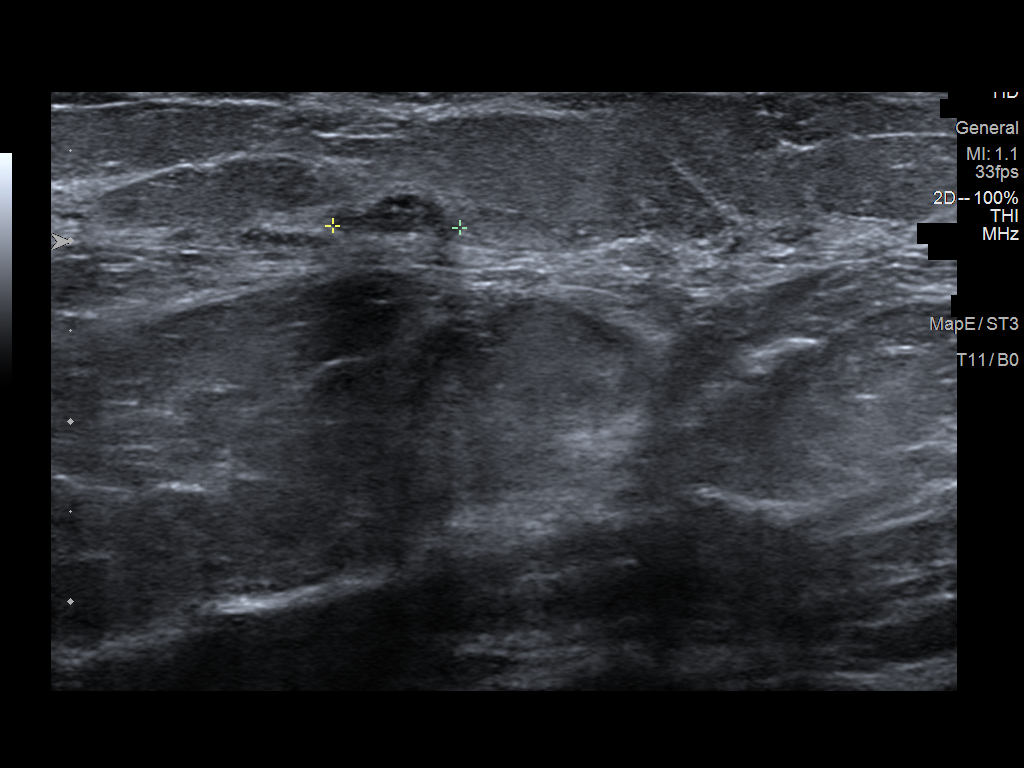

[5 of 5 positions shown; findings below may reference images not displayed]

ACR Breast Density Category b: There are scattered areas of
fibroglandular density.
FINDINGS: No suspicious calcifications, masses or areas of distortion are seen
in the bilateral breasts.

Mammographic images were processed with CAD.

Physical exam of the right breast demonstrates mild dimpling of the
outer slightly upper breast at approximately 930 adjacent to the
nipple. This is asymmetric to the contralateral breast. No palpable
masses or skin changes are otherwise identified.

Ultrasound targeted to the upper-outer quadrant right breast
demonstrates a hypoechoic possibly intraductal mass at 930, 2 cm
from the nipple measuring 7 x 7 x 2 mm. There is slight posterior
acoustic shadowing.
IMPRESSION: A 7 mm mass is identified at 930 in the right breast deep to the
skin dimpling identified by the patient.

RECOMMENDATION:
Ultrasound-guided biopsy is recommended for the 7 mm right breast
mass deep to the area of skin dimpling. This has been scheduled for
01/17/2015 at 3 p.m..

I have discussed the findings and recommendations with the patient.
Results were also provided in writing at the conclusion of the
visit. If applicable, a reminder letter will be sent to the patient
regarding the next appointment.

BI-RADS CATEGORY  4: Suspicious.

## 2017-01-19 IMAGING — MG MM DIAG BREAST TOMO BILATERAL
6 of 10 series · 6 of 30 positions shown · non-contrast
Comparison: Previous exam(s).

CLINICAL DATA: 49-year-old female presenting for evaluation of an
area of dimpling in the lateral right breast, present for
approximately 6 months.

EXAM:
DIGITAL DIAGNOSTIC RIGHT MAMMOGRAM WITH 3D TOMOSYNTHESIS AND CAD
RIGHT BREAST ULTRASOUND

[L MLO (1 of 2)]
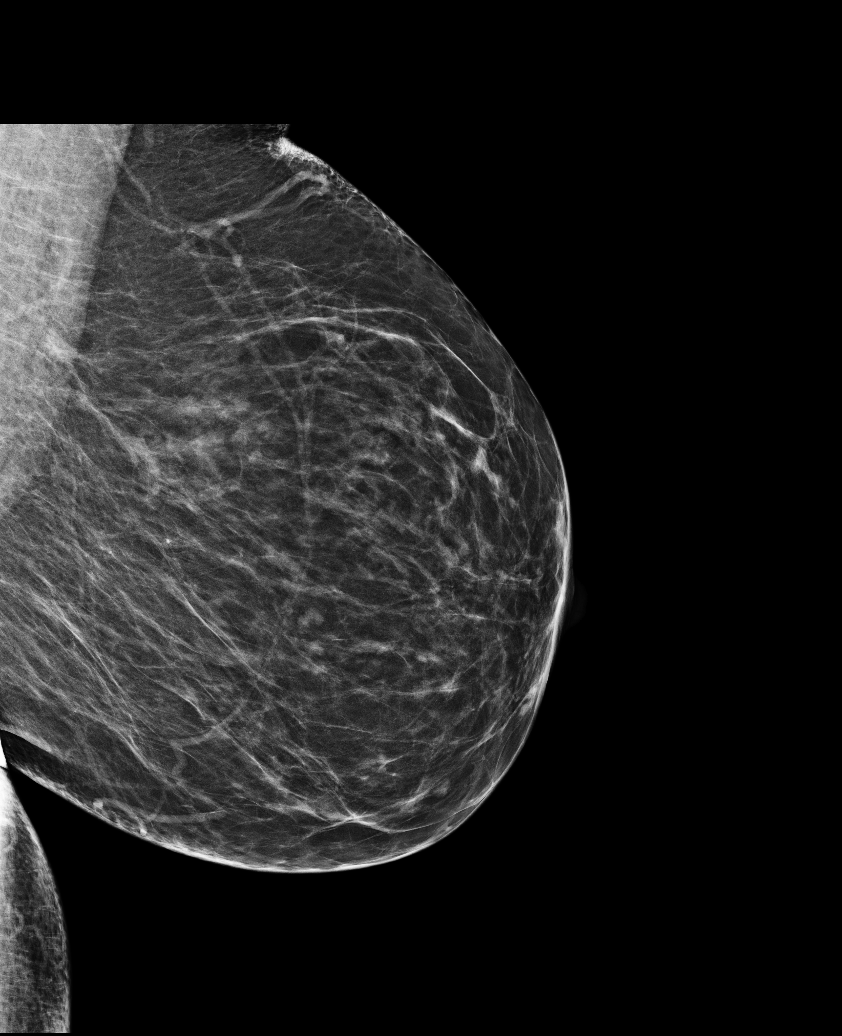

[R MLO]
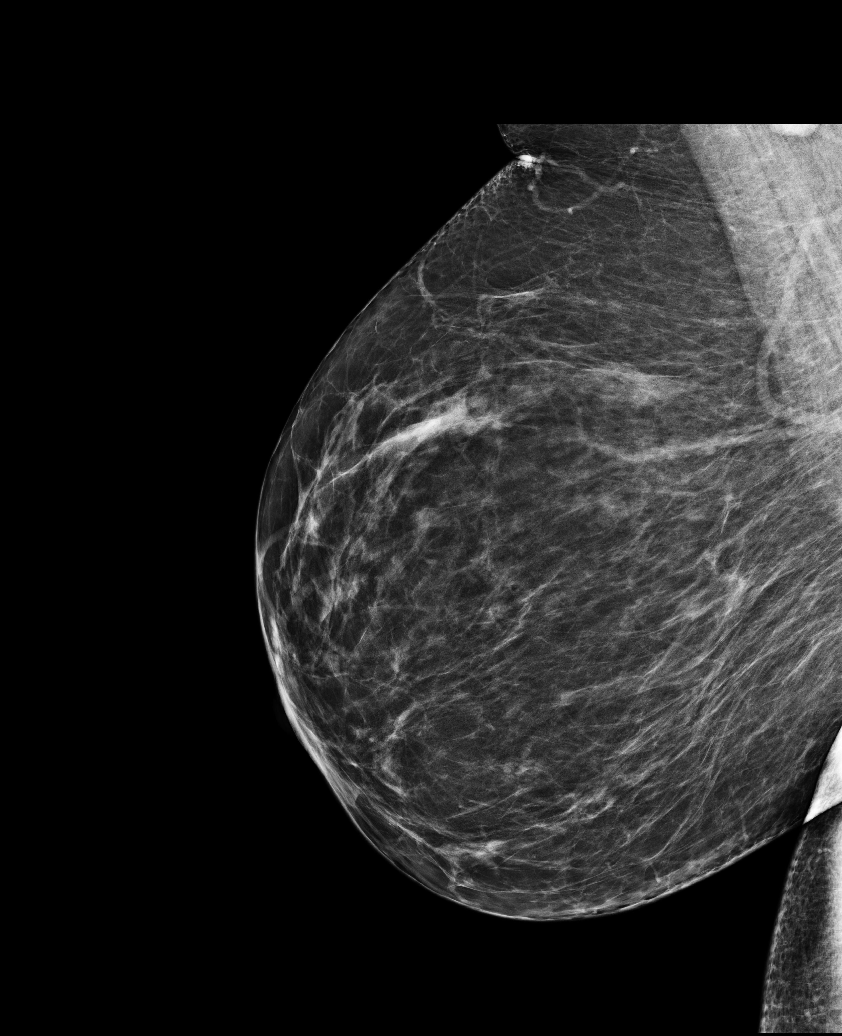

[L MLO (2 of 2)]
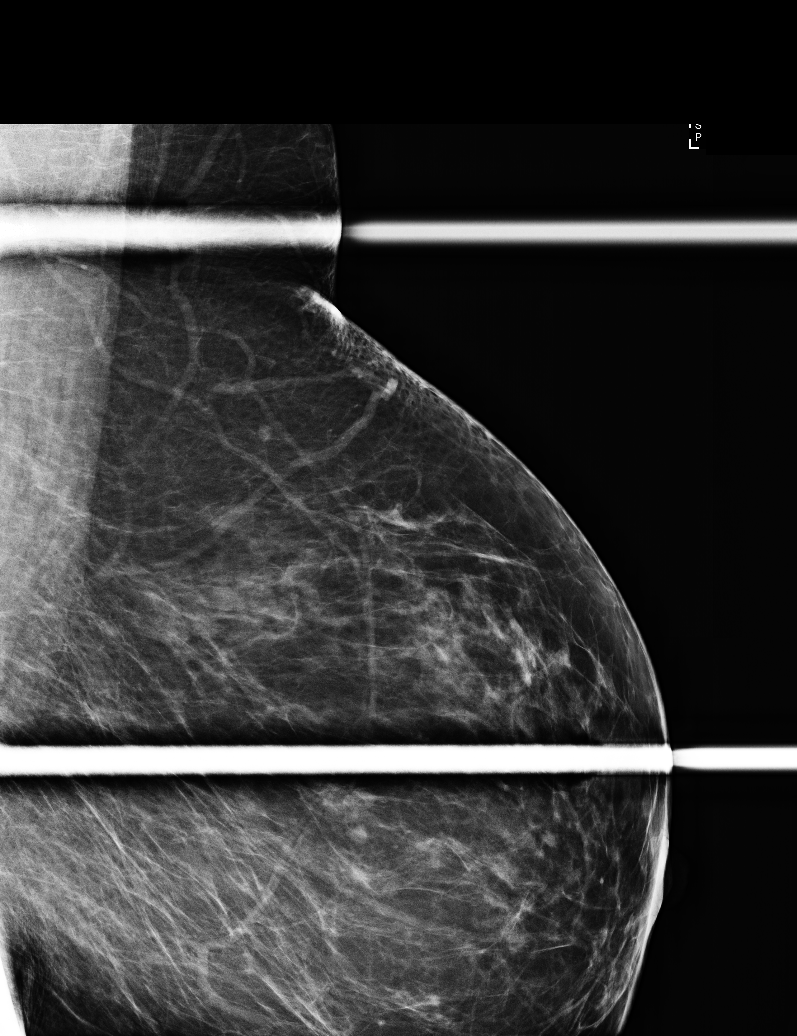

[R CC]
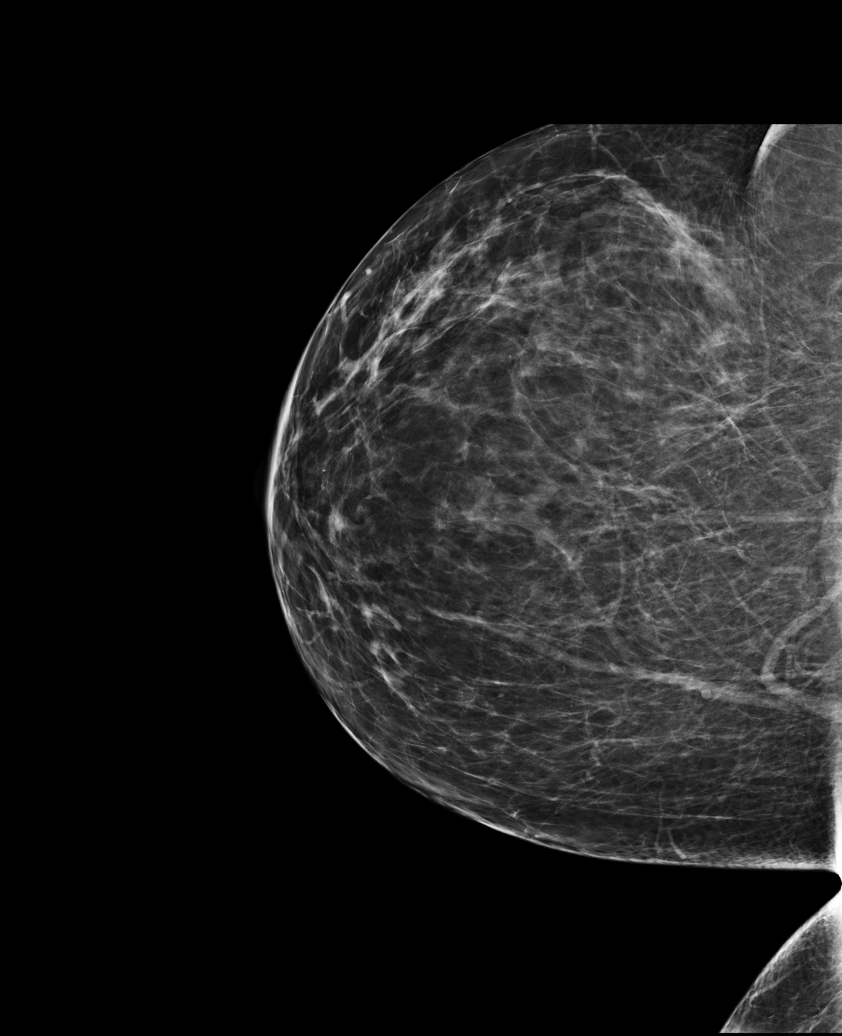

[L CC]
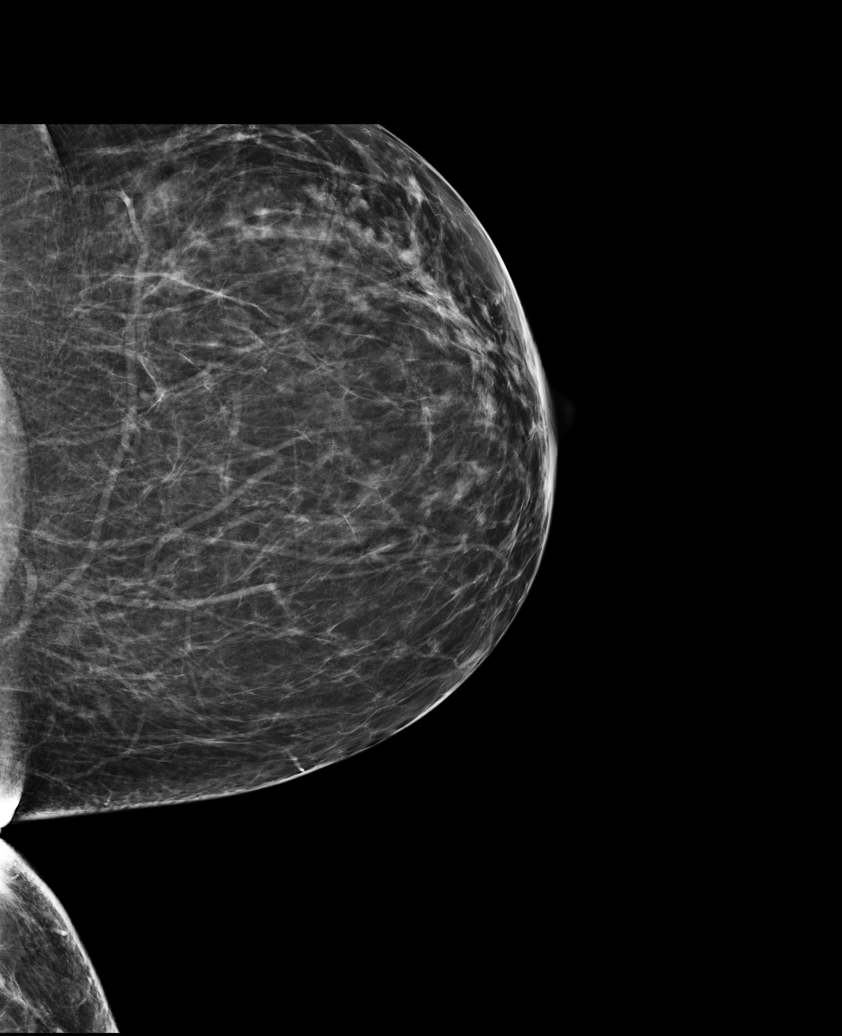

[L MLO tomo · tomo slice 45/90.0]
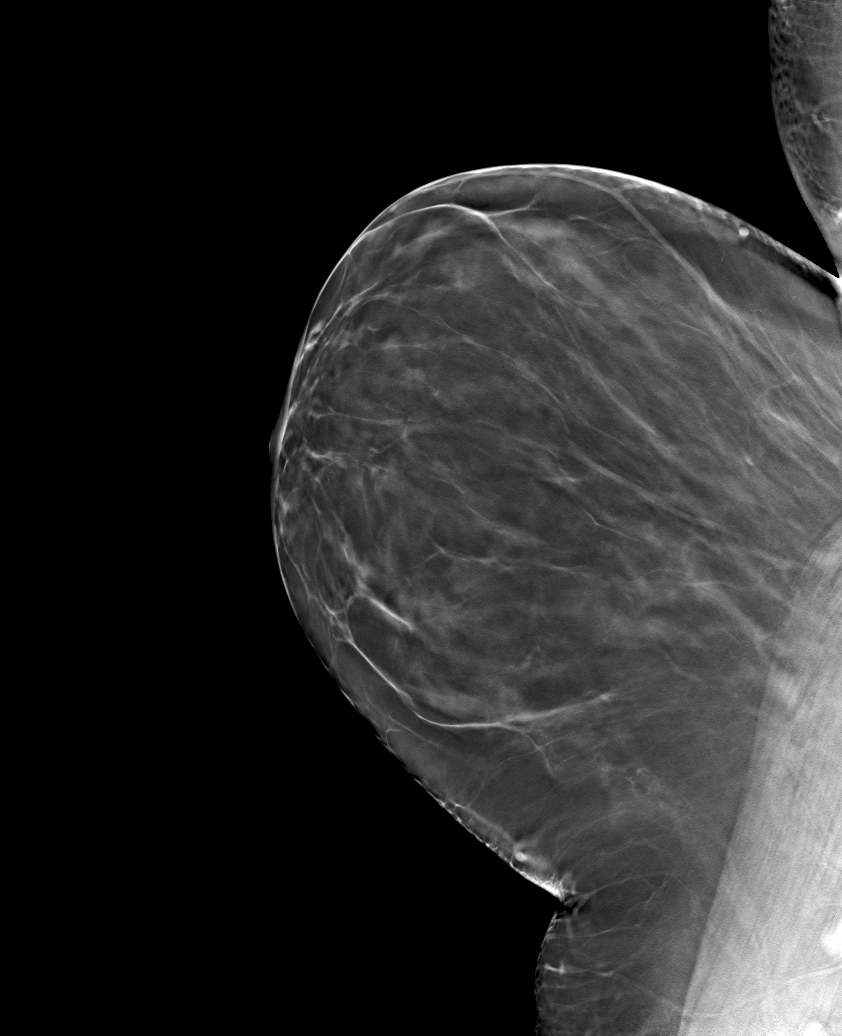

[6 of 30 positions shown; findings below may reference images not displayed]

ACR Breast Density Category b: There are scattered areas of
fibroglandular density.
FINDINGS: No suspicious calcifications, masses or areas of distortion are seen
in the bilateral breasts.

Mammographic images were processed with CAD.

Physical exam of the right breast demonstrates mild dimpling of the
outer slightly upper breast at approximately 930 adjacent to the
nipple. This is asymmetric to the contralateral breast. No palpable
masses or skin changes are otherwise identified.

Ultrasound targeted to the upper-outer quadrant right breast
demonstrates a hypoechoic possibly intraductal mass at 930, 2 cm
from the nipple measuring 7 x 7 x 2 mm. There is slight posterior
acoustic shadowing.
IMPRESSION: A 7 mm mass is identified at 930 in the right breast deep to the
skin dimpling identified by the patient.

RECOMMENDATION:
Ultrasound-guided biopsy is recommended for the 7 mm right breast
mass deep to the area of skin dimpling. This has been scheduled for
01/17/2015 at 3 p.m..

I have discussed the findings and recommendations with the patient.
Results were also provided in writing at the conclusion of the
visit. If applicable, a reminder letter will be sent to the patient
regarding the next appointment.

BI-RADS CATEGORY  4: Suspicious.

## 2017-03-01 DIAGNOSIS — Z1321 Encounter for screening for nutritional disorder: Secondary | ICD-10-CM | POA: Diagnosis not present

## 2017-03-01 DIAGNOSIS — Z01419 Encounter for gynecological examination (general) (routine) without abnormal findings: Secondary | ICD-10-CM | POA: Diagnosis not present

## 2017-03-01 DIAGNOSIS — Z780 Asymptomatic menopausal state: Secondary | ICD-10-CM | POA: Diagnosis not present

## 2017-03-28 DIAGNOSIS — R52 Pain, unspecified: Secondary | ICD-10-CM | POA: Diagnosis not present

## 2017-03-28 DIAGNOSIS — J3489 Other specified disorders of nose and nasal sinuses: Secondary | ICD-10-CM | POA: Diagnosis not present

## 2017-04-01 DIAGNOSIS — R05 Cough: Secondary | ICD-10-CM | POA: Diagnosis not present

## 2017-08-04 ENCOUNTER — Encounter (HOSPITAL_COMMUNITY): Payer: Self-pay | Admitting: *Deleted

## 2017-08-04 ENCOUNTER — Emergency Department (HOSPITAL_COMMUNITY)
Admission: EM | Admit: 2017-08-04 | Discharge: 2017-08-04 | Disposition: A | Discharge status: Home / Self Care | Payer: 59 | Attending: Emergency Medicine | Admitting: Emergency Medicine

## 2017-08-04 ENCOUNTER — Other Ambulatory Visit: Payer: Self-pay

## 2017-08-04 DIAGNOSIS — S6992XA Unspecified injury of left wrist, hand and finger(s), initial encounter: Secondary | ICD-10-CM | POA: Diagnosis present

## 2017-08-04 DIAGNOSIS — Y929 Unspecified place or not applicable: Secondary | ICD-10-CM | POA: Diagnosis not present

## 2017-08-04 DIAGNOSIS — Y939 Activity, unspecified: Secondary | ICD-10-CM | POA: Insufficient documentation

## 2017-08-04 DIAGNOSIS — Z79899 Other long term (current) drug therapy: Secondary | ICD-10-CM | POA: Diagnosis not present

## 2017-08-04 DIAGNOSIS — Y999 Unspecified external cause status: Secondary | ICD-10-CM | POA: Diagnosis not present

## 2017-08-04 DIAGNOSIS — L818 Other specified disorders of pigmentation: Secondary | ICD-10-CM | POA: Diagnosis not present

## 2017-08-04 DIAGNOSIS — Z23 Encounter for immunization: Secondary | ICD-10-CM | POA: Diagnosis not present

## 2017-08-04 DIAGNOSIS — W260XXA Contact with knife, initial encounter: Secondary | ICD-10-CM | POA: Diagnosis not present

## 2017-08-04 DIAGNOSIS — S61512A Laceration without foreign body of left wrist, initial encounter: Secondary | ICD-10-CM | POA: Diagnosis not present

## 2017-08-04 DIAGNOSIS — L821 Other seborrheic keratosis: Secondary | ICD-10-CM | POA: Diagnosis not present

## 2017-08-04 DIAGNOSIS — D225 Melanocytic nevi of trunk: Secondary | ICD-10-CM | POA: Diagnosis not present

## 2017-08-04 MED ORDER — TETANUS-DIPHTH-ACELL PERTUSSIS 5-2.5-18.5 LF-MCG/0.5 IM SUSP
0.5000 mL | Freq: Once | INTRAMUSCULAR | Status: AC
  Administered 2017-08-04: 0.5 mL via INTRAMUSCULAR
  Filled 2017-08-04: qty 0.5

## 2017-08-04 NOTE — ED Triage Notes (Signed)
Pt cut her left wrist with a pocket knife  X 1 hour ago; pt called ems and ems applied a pressure bandage;

## 2017-08-04 NOTE — ED Provider Notes (Signed)
[facility] EMERGENCY DEPARTMENT Provider Note   CSN: [id number] Arrival date & time: 08/04/17  1856     History   Chief Complaint Chief Complaint  Patient presents with  . Laceration    HPI Joann Hickman is a 52 y.o. female.  HPI   Joann Hickman is a 52 y.o. female who presents to the Emergency Department complaining of a small laceration to her left wrist.  She states that she was using a pocket knife to cut a zip tie when the knife slipped and caused a step type wound to her medial wrist.  She reports bleeding of the wound for approximately 5 to 10 minutes that was controlled with direct pressure.  She denies aspirin or anticoagulants.  Bleeding controlled prior to arrival.  Last tetanus is unknown.  She denies numbness, weakness, or pain to her wrist or hand.  Wound has not been cleaned prior to arrival.  Denies history of bleeding disorders    Past Medical History:  Diagnosis Date  . Anxiety   . Depression   . GERD (gastroesophageal reflux disease)   . Herpes    genital  . PONV (postoperative nausea and vomiting)   . UTI (lower urinary tract infection)     There are no active problems to display for this patient.   Past Surgical History:  Procedure Laterality Date  . BACK SURGERY  2012   lumbar lam  . BLADDER SUSPENSION  2010  . DIAGNOSTIC LAPAROSCOPY  2005  . DILATION AND CURETTAGE OF UTERUS  2008  . ORIF ANKLE FRACTURE Right 04/16/2014   Procedure: OPEN REDUCTION INTERNAL FIXATION (ORIF) DISTAL FIBULAR ANKLE FRACTURE;  Surgeon: Tania Ade, MD;  Location: [facility];  Service: Orthopedics;  Laterality: Right;  Open reduction internal fixation distal fibular ankle fracture  . TUBAL LIGATION    . tubal reversal  2003  . VAGINAL HYSTERECTOMY  2010     OB History    Gravida  2   Para  2   Term  2   Preterm      AB      Living  2     SAB      TAB      Ectopic      Multiple      Live Births               Home  Medications    Prior to Admission medications   Medication Sig Start Date End Date Taking? Authorizing Provider  escitalopram (LEXAPRO) 5 MG tablet Take 5 mg by mouth daily.    [provider]  NEXIUM 24HR 20 MG capsule Take 20 mg by mouth daily. 03/18/14   [provider]  PROAIR HFA 108 (90 BASE) MCG/ACT inhaler Inhale 1 puff into the lungs every 4 (four) hours as needed.  01/17/14   [provider]  valACYclovir (VALTREX) 1000 MG tablet Take 500 mg by mouth daily as needed (flare ups).  03/26/14   [provider]    Family History Family History  Problem Relation Age of Onset  . Hypertension Mother   . Diabetes Mother   . Cancer Mother   . Hypertension Father   . Cancer Father     Social History Social History   Tobacco Use  . Smoking status: Never Smoker  . Smokeless tobacco: Never Used  Substance Use Topics  . Alcohol use: Yes    Comment: occ  . Drug use: No  Allergies   Hydrocodone-guaifenesin   Review of Systems Review of Systems  Constitutional: Negative for chills and fever.  Musculoskeletal: Negative for arthralgias, back pain and joint swelling.  Skin: Positive for wound.       Laceration left wrist  Neurological: Negative for dizziness, weakness and numbness.  Hematological: Does not bruise/bleed easily.  All other systems reviewed and are negative.    Physical Exam Updated Vital Signs BP 139/86 (BP Location: Right Arm)   Pulse 66   Temp 97.8 F (36.6 C) (Oral)   Resp 18   Ht 5\' 3"  (1.6 m)   Wt 89.4 kg (197 lb)   SpO2 100%   BMI 34.90 kg/m   Physical Exam  Constitutional: She appears well-developed and well-nourished. No distress.  Cardiovascular: Normal rate, regular rhythm and intact distal pulses.  Radial pulses brisk bilaterally  Pulmonary/Chest: Effort normal and breath sounds normal. No respiratory distress.  Musculoskeletal: Normal range of motion. She exhibits no edema or tenderness.       Left  wrist: She exhibits laceration. She exhibits normal range of motion, no tenderness, no bony tenderness and no swelling.       Arms: Patient has full range of motion of the fingers of the left hand and left wrist.  Grip strength is strong and symmetrical bilaterally.  No edema.  Normal finger/thumb opposition.  Neurological: She is alert. No sensory deficit.  Skin: Capillary refill takes less than 2 seconds.  Less than 1 cm laceration to the medial aspect of the left proximal wrist.  No edema, bleeding controlled.  Nursing note and vitals reviewed.    ED Treatments / Results  Labs (all labs ordered are listed, but only abnormal results are displayed) Labs Reviewed - No data to display  EKG None  Radiology No results found.  Procedures Procedures (including critical care time)  LACERATION REPAIR Performed by: Quanta Roher L. Authorized by: Hale Bogus Consent: Verbal consent obtained. Risks and benefits: risks, benefits and alternatives were discussed Consent given by: patient Patient identity confirmed: provided demographic data Prepped and Draped in normal sterile fashion Wound explored  Laceration Location: left wrist  Laceration Length: < 1 cm  No Foreign Bodies seen or palpated  Anesthesia: none  Irrigation method: syringe Amount of cleaning: standard  Skin closure: steri strip  Number of sutures: one  Technique: topical application  Patient tolerance: Patient tolerated the procedure well with no immediate complications.   Medications Ordered in ED Medications  Tdap (BOOSTRIX) injection 0.5 mL (0.5 mLs Intramuscular Given 08/04/17 1940)     Initial Impression / Assessment and Plan / ED Course  I have reviewed the triage vital signs and the nursing notes.  Pertinent labs & imaging results that were available during my care of the patient were reviewed by me and considered in my medical decision making (see chart for details).     Bleeding  controlled prior to ER arrival.  NV intact.  No motor weakness.   Wound cleaned and irrigated, no FB's.  Td updated.  Mild pressure dressing applied.  Wound care instructions discussed and pt advised to ER return if signs of infection develop.    Final Clinical Impressions(s) / ED Diagnoses   Final diagnoses:  Laceration of left wrist, initial encounter    ED Discharge Orders    None       Bufford Lope 08/04/17 Lona Kettle    Virgel Manifold, MD 08/04/17 2053

## 2017-08-04 NOTE — Discharge Instructions (Addendum)
Keep the wound clean and dry.  You may remove the pressure dressing in the morning and replaced with a bandage.  Tylenol or ibuprofen if needed for pain.  Avoid excessive use of your left hand for at least 24 to 48 hours.  Follow-up with your doctor or return to the ER for any signs of infection such as increasing pain, redness, swelling, or red streaks

## 2017-08-24 DIAGNOSIS — I10 Essential (primary) hypertension: Secondary | ICD-10-CM | POA: Diagnosis not present

## 2017-09-02 DIAGNOSIS — R300 Dysuria: Secondary | ICD-10-CM | POA: Diagnosis not present

## 2017-10-12 DIAGNOSIS — Z23 Encounter for immunization: Secondary | ICD-10-CM | POA: Diagnosis not present

## 2017-10-12 DIAGNOSIS — E559 Vitamin D deficiency, unspecified: Secondary | ICD-10-CM | POA: Diagnosis not present

## 2017-10-12 DIAGNOSIS — Z79899 Other long term (current) drug therapy: Secondary | ICD-10-CM | POA: Diagnosis not present

## 2017-10-12 DIAGNOSIS — Z0000 Encounter for general adult medical examination without abnormal findings: Secondary | ICD-10-CM | POA: Diagnosis not present

## 2017-10-12 DIAGNOSIS — K909 Intestinal malabsorption, unspecified: Secondary | ICD-10-CM | POA: Diagnosis not present

## 2017-11-28 DIAGNOSIS — J322 Chronic ethmoidal sinusitis: Secondary | ICD-10-CM | POA: Diagnosis not present

## 2018-01-06 DIAGNOSIS — J209 Acute bronchitis, unspecified: Secondary | ICD-10-CM | POA: Diagnosis not present

## 2018-01-16 ENCOUNTER — Ambulatory Visit
Admission: RE | Admit: 2018-01-16 | Discharge: 2018-01-16 | Disposition: A | Discharge status: Home / Self Care | Payer: 59 | Source: Ambulatory Visit | Attending: Geriatric Medicine | Admitting: Geriatric Medicine

## 2018-01-16 ENCOUNTER — Other Ambulatory Visit: Payer: Self-pay | Admitting: Geriatric Medicine

## 2018-01-16 DIAGNOSIS — N72 Inflammatory disease of cervix uteri: Secondary | ICD-10-CM

## 2018-01-16 DIAGNOSIS — R05 Cough: Secondary | ICD-10-CM | POA: Diagnosis not present

## 2018-02-24 IMAGING — CR DG ABDOMEN 1V
2 series · 2 of 2 positions shown · non-contrast
Comparison: CT 01/21/2016.

CLINICAL DATA: Constipation.

EXAM:
ABDOMEN - 1 VIEW

[t abdomen supine (1 of 2)]
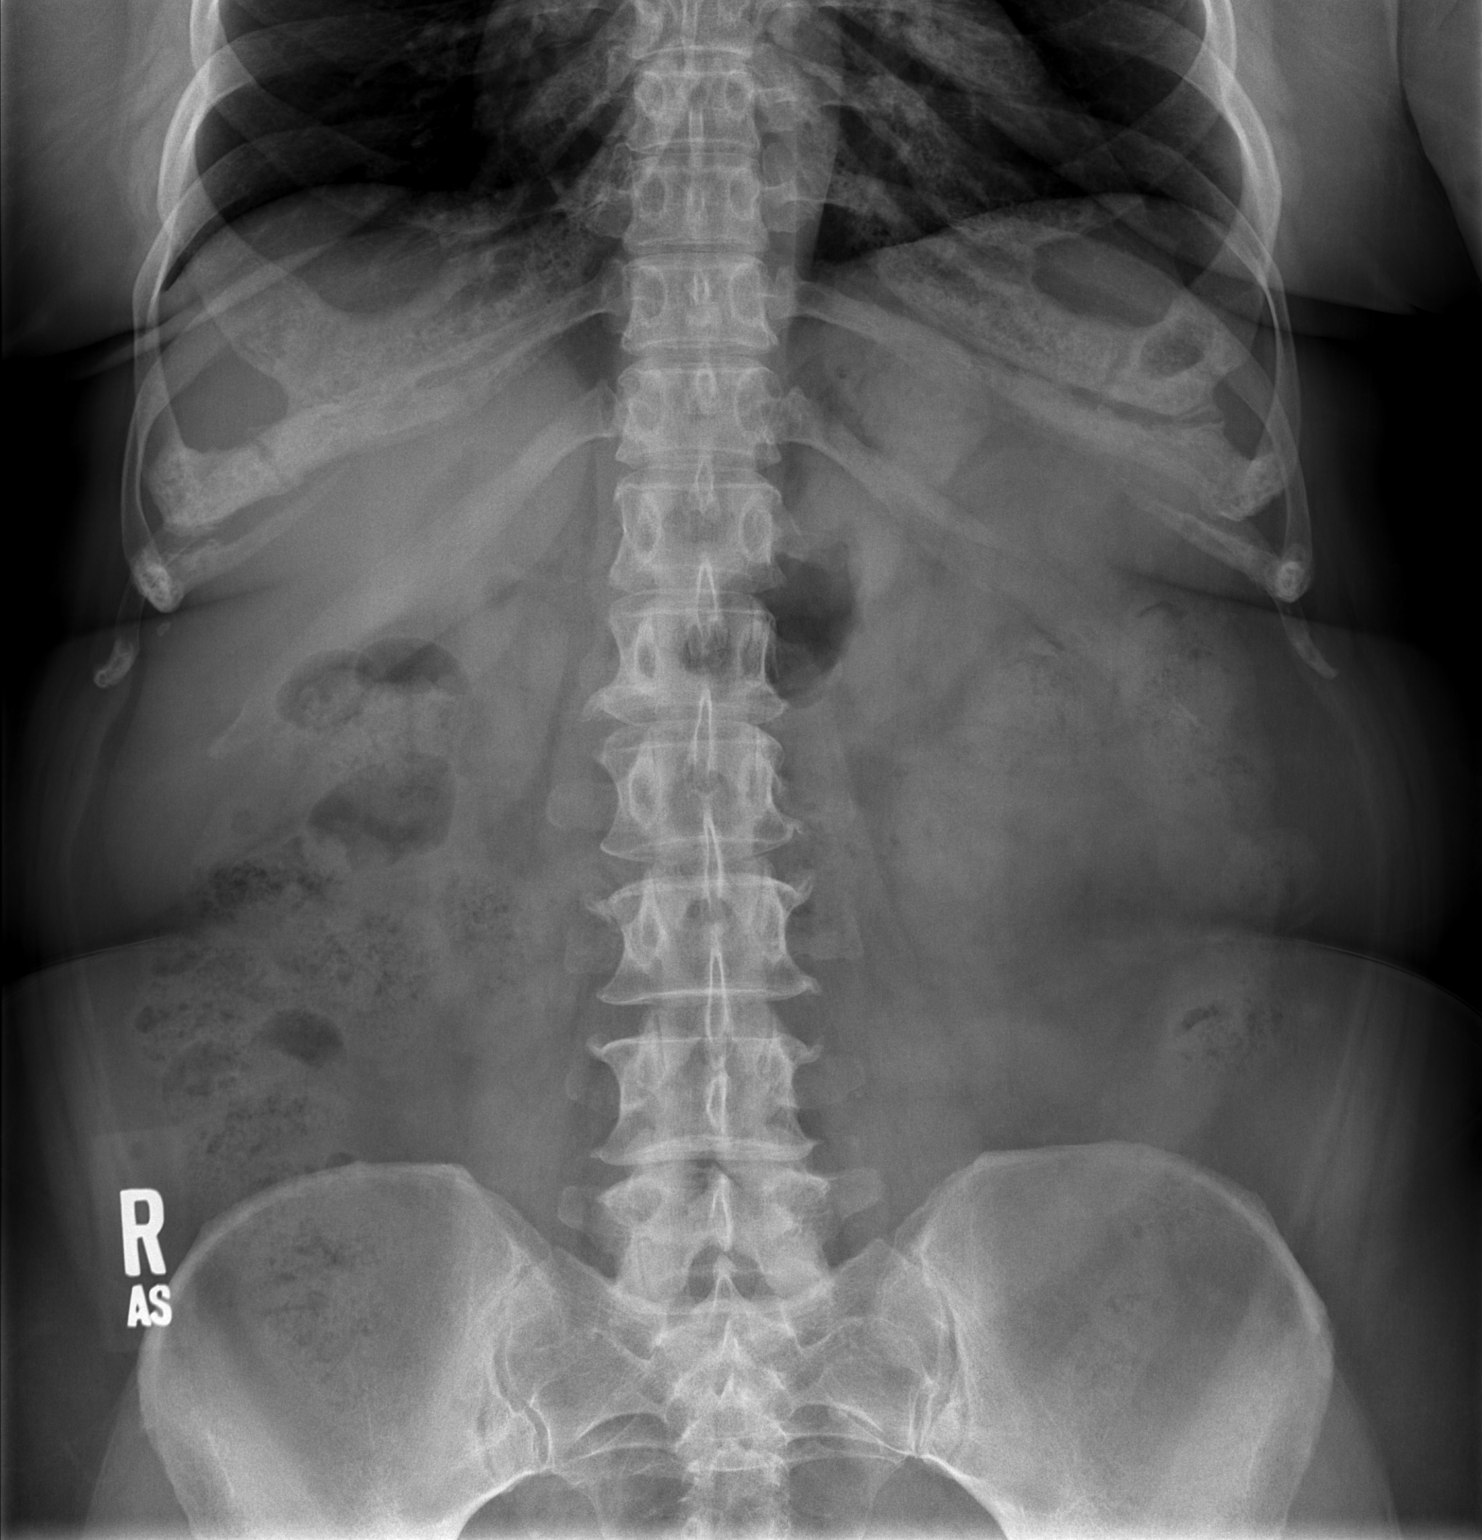

[t abdomen supine (2 of 2)]
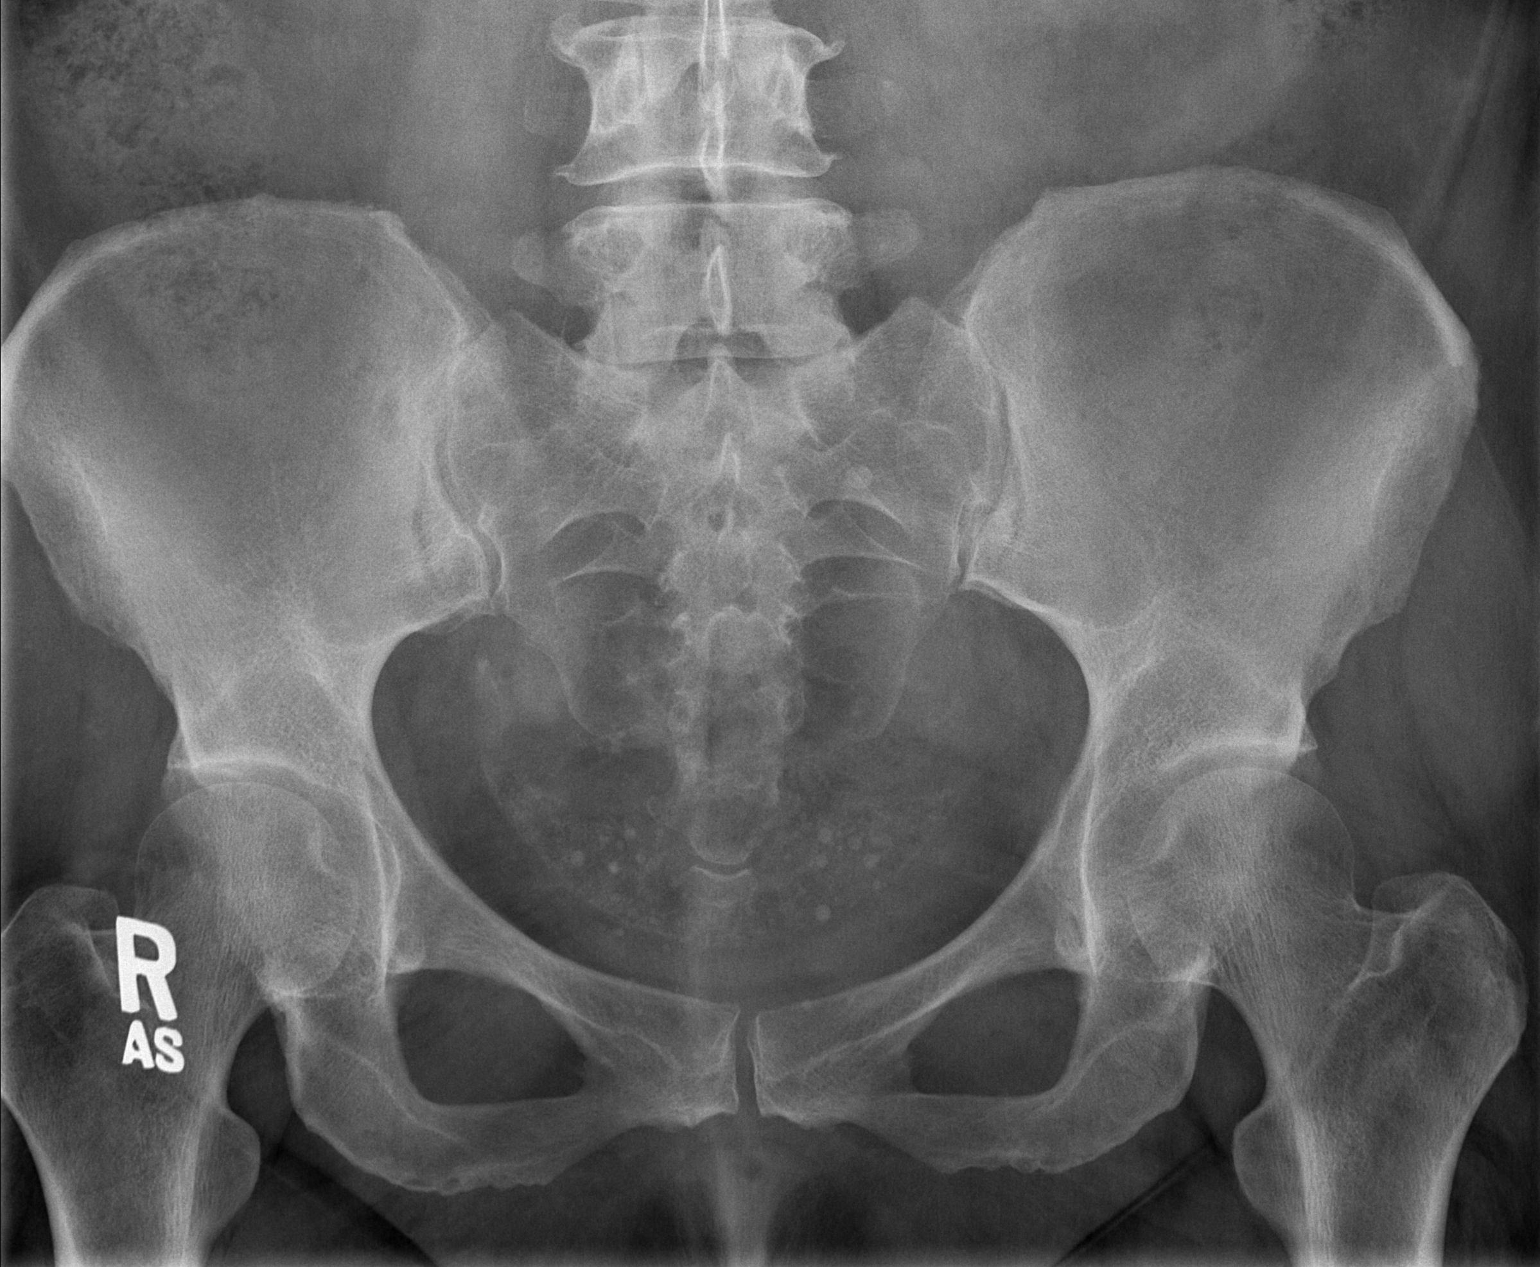

[2 of 2 positions shown; findings below may reference images not displayed]

FINDINGS: Soft tissue structures are unremarkable. No bowel distention. Stool
noted throughout the colon. No free air. No acute bony abnormality.
Pelvic calcifications noted consistent with phleboliths. Distal
ureteral stones cannot be excluded. Degenerative changes lumbar
spine.
IMPRESSION: No acute or focal abnormality identified. Moderate amount of stool
noted throughout the colon. Innumerable pelvic calcifications noted
most consistent with phleboliths. Distal ureteral stones cannot be
excluded.

## 2018-03-22 DIAGNOSIS — R300 Dysuria: Secondary | ICD-10-CM | POA: Diagnosis not present

## 2018-03-22 DIAGNOSIS — E538 Deficiency of other specified B group vitamins: Secondary | ICD-10-CM | POA: Diagnosis not present

## 2018-03-22 DIAGNOSIS — Z79899 Other long term (current) drug therapy: Secondary | ICD-10-CM | POA: Diagnosis not present

## 2018-03-22 DIAGNOSIS — E559 Vitamin D deficiency, unspecified: Secondary | ICD-10-CM | POA: Diagnosis not present

## 2018-04-07 DIAGNOSIS — J069 Acute upper respiratory infection, unspecified: Secondary | ICD-10-CM | POA: Diagnosis not present

## 2018-04-08 ENCOUNTER — Ambulatory Visit: Payer: 59 | Admitting: Podiatry

## 2018-04-08 ENCOUNTER — Encounter: Payer: Self-pay | Admitting: Podiatry

## 2018-04-08 DIAGNOSIS — L600 Ingrowing nail: Secondary | ICD-10-CM | POA: Diagnosis not present

## 2018-04-08 MED ORDER — GENTAMICIN SULFATE 0.1 % EX CREA: 1.0000 "application " | TOPICAL_CREAM | Freq: Two times a day (BID) | CUTANEOUS | 0 refills | Status: AC

## 2018-04-16 NOTE — Progress Notes (Signed)
   Subjective: Patient presents today for evaluation of pain to the medial border of the right hallux that began 2 weeks ago. She reports associated redness. Patient is concerned for possible ingrown nail. She states the pain started after getting a pedicure. She has been soaking the toe in Epsom salt and applying Neosporin for treatment. Touching the toe and wearing shoes increases the pain. Patient presents today for further treatment and evaluation.  Past Medical History:  Diagnosis Date  . Anxiety   . Depression   . GERD (gastroesophageal reflux disease)   . Herpes    genital  . PONV (postoperative nausea and vomiting)   . UTI (lower urinary tract infection)     Objective:  General: Well developed, nourished, in no acute distress, alert and oriented x3   Dermatology: Skin is warm, dry and supple bilateral. Medial border of the right hallux appears to be erythematous with evidence of an ingrowing nail. Pain on palpation noted to the border of the nail fold. The remaining nails appear unremarkable at this time. There are no open sores, lesions.  Vascular: Dorsalis Pedis artery and Posterior Tibial artery pedal pulses palpable. No lower extremity edema noted.   Neruologic: Grossly intact via light touch bilateral.  Musculoskeletal: Muscular strength within normal limits in all groups bilateral. Normal range of motion noted to all pedal and ankle joints.   Assesement: #1 Paronychia with ingrowing nail medial border right hallux  #2 Pain in toe #3 Incurvated nail  Plan of Care:  1. Patient evaluated.  2. Discussed treatment alternatives and plan of care. Explained nail avulsion procedure and post procedure course to patient. 3. Patient opted for permanent partial nail avulsion of the medial border of the right hallux.  4. Prior to procedure, local anesthesia infiltration utilized using 3 ml of a 50:50 mixture of 2% plain lidocaine and 0.5% plain marcaine in a normal hallux block  fashion and a betadine prep performed.  5. Partial permanent nail avulsion with chemical matrixectomy performed using [id number] applications of phenol followed by alcohol flush.  6. Light dressing applied. 7. Prescription for Gentamicin cream provided to patient to use daily with a bandage.  8. Return to clinic in 2 weeks.   Edrick Kins, DPM [facility]  Dr. Edrick Kins, [address]                                        [place], [place] [address]                Office [phone]  Fax [phone]

## 2018-04-26 ENCOUNTER — Ambulatory Visit: Payer: 59 | Admitting: Podiatry

## 2018-04-26 ENCOUNTER — Other Ambulatory Visit: Payer: Self-pay

## 2018-04-26 ENCOUNTER — Encounter: Payer: Self-pay | Admitting: Podiatry

## 2018-04-26 DIAGNOSIS — L600 Ingrowing nail: Secondary | ICD-10-CM

## 2018-04-26 DIAGNOSIS — Z01419 Encounter for gynecological examination (general) (routine) without abnormal findings: Secondary | ICD-10-CM | POA: Diagnosis not present

## 2018-04-26 DIAGNOSIS — Z1231 Encounter for screening mammogram for malignant neoplasm of breast: Secondary | ICD-10-CM | POA: Diagnosis not present

## 2018-04-26 DIAGNOSIS — R102 Pelvic and perineal pain: Secondary | ICD-10-CM | POA: Diagnosis not present

## 2018-04-26 DIAGNOSIS — Z6835 Body mass index (BMI) 35.0-35.9, adult: Secondary | ICD-10-CM | POA: Diagnosis not present

## 2018-05-01 NOTE — Progress Notes (Signed)
   Subjective: Patient presents today status post ingrown nail permanent avulsion procedure of the medial border of the right hallux that was performed on 04/08/2018. Patient states that the toe and nail fold is feeling much better. She denies any significant pain or drainage. She has been using the Gentamicin cream as directed. Patient is here for further evaluation and treatment.   Past Medical History:  Diagnosis Date  . Anxiety   . Depression   . GERD (gastroesophageal reflux disease)   . Herpes    genital  . PONV (postoperative nausea and vomiting)   . UTI (lower urinary tract infection)     Objective: Skin is warm, dry and supple. Nail and respective nail fold appears to be healing appropriately. Open wound to the associated nail fold with a granular wound base and moderate amount of fibrotic tissue. Minimal drainage noted. Mild erythema around the periungual region likely due to phenol chemical matricectomy.  Assessment: #1 postop permanent partial nail avulsion medial border right hallux  #2 open wound periungual nail fold of respective digit.   Plan of care: #1 patient was evaluated  #2 debridement of open wound was performed to the periungual border of the respective toe using a currette. Antibiotic ointment and Band-Aid was applied. #3 patient is to return to clinic on a PRN basis.  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    Edrick Kins, DPM [facility]  Dr. Edrick Kins, [address]                                        [place], [place] [address]                Office [phone]  Fax [phone]

## 2018-09-07 ENCOUNTER — Emergency Department (HOSPITAL_COMMUNITY)
Admission: EM | Admit: 2018-09-07 | Discharge: 2018-09-07 | Disposition: A | Discharge status: Home / Self Care | Payer: 59 | Attending: Emergency Medicine | Admitting: Emergency Medicine

## 2018-09-07 ENCOUNTER — Other Ambulatory Visit: Payer: Self-pay

## 2018-09-07 DIAGNOSIS — Z5321 Procedure and treatment not carried out due to patient leaving prior to being seen by health care provider: Secondary | ICD-10-CM | POA: Insufficient documentation

## 2018-09-07 DIAGNOSIS — R1030 Lower abdominal pain, unspecified: Secondary | ICD-10-CM | POA: Diagnosis present

## 2018-09-07 LAB — COMPREHENSIVE METABOLIC PANEL
ALT: 15 U/L (ref 0–44)
AST: 16 U/L (ref 15–41)
Albumin: 3.7 g/dL (ref 3.5–5.0)
Alkaline Phosphatase: 59 U/L (ref 38–126)
Anion gap: 11 (ref 5–15)
BUN: 13 mg/dL (ref 6–20)
CO2: 27 mmol/L (ref 22–32)
Calcium: 9 mg/dL (ref 8.9–10.3)
Chloride: 102 mmol/L (ref 98–111)
Creatinine, Ser: 0.99 mg/dL (ref 0.44–1.00)
GFR calc Af Amer: >60 mL/min (ref >60)
GFR calc non Af Amer: >60 mL/min (ref >60)
Glucose, Bld: 124 mg/dL — ABNORMAL HIGH (ref 70–99)
Potassium: 4.2 mmol/L (ref 3.5–5.1)
Sodium: 140 mmol/L (ref 135–145)
Total Bilirubin: 0.3 mg/dL (ref 0.3–1.2)
Total Protein: 6.9 g/dL (ref 6.5–8.1)

## 2018-09-07 LAB — CBC
HCT: 41.5 % (ref 36.0–46.0)
Hemoglobin: 14.2 g/dL (ref 12.0–15.0)
MCH: 31.1 pg (ref 26.0–34.0)
MCHC: 34.2 g/dL (ref 30.0–36.0)
MCV: 90.8 fL (ref 80.0–100.0)
Platelets: 290 10*3/uL (ref 150–400)
RBC: 4.57 MIL/uL (ref 3.87–5.11)
RDW: 11.9 % (ref 11.5–15.5)
WBC: 11.5 10*3/uL — ABNORMAL HIGH (ref 4.0–10.5)
nRBC: 0 % (ref 0.0–0.2)

## 2018-09-07 LAB — URINALYSIS, ROUTINE W REFLEX MICROSCOPIC
Bilirubin Urine: NEGATIVE
Glucose, UA: NEGATIVE mg/dL
Ketones, ur: NEGATIVE mg/dL
Leukocytes,Ua: NEGATIVE
Nitrite: NEGATIVE
Protein, ur: NEGATIVE mg/dL
Specific Gravity, Urine: 1.011 (ref 1.005–1.030)
pH: 6 (ref 5.0–8.0)

## 2018-09-07 LAB — LIPASE, BLOOD: Lipase: 37 U/L (ref 11–51)

## 2018-09-07 MED ORDER — ONDANSETRON 4 MG PO TBDP: 4.0000 mg | ORAL_TABLET | Freq: Once | ORAL | Status: DC | PRN

## 2018-09-07 NOTE — ED Triage Notes (Signed)
Pt c/o abd pain x 1-1/2 hours. States that is started with lower abd pain accompanied by diarrhea. States that the pain was so bad, that she became sweaty and "nearly passed out". A&O x4 and NAD noted at this time.

## 2018-09-07 NOTE — ED Notes (Signed)
Pt. Left waiting room. Tech explained process and encouraged pt. To stay and be seen. Charge notified.
